# Patient Record
Sex: Male | Born: 1985 | Race: Black or African American | Hispanic: No | Marital: Single | Smoking: Current some day smoker
Health system: Southern US, Community
[De-identification: ages and names within clinical notes are randomized; demographics above are authoritative.]

## PROBLEM LIST (undated history)

## (undated) DIAGNOSIS — L0291 Cutaneous abscess, unspecified: Secondary | ICD-10-CM

## (undated) DIAGNOSIS — S62609A Fracture of unspecified phalanx of unspecified finger, initial encounter for closed fracture: Secondary | ICD-10-CM

## (undated) DIAGNOSIS — L732 Hidradenitis suppurativa: Secondary | ICD-10-CM

## (undated) DIAGNOSIS — I10 Essential (primary) hypertension: Secondary | ICD-10-CM

## (undated) HISTORY — DX: Hidradenitis suppurativa: L73.2

## (undated) HISTORY — DX: Essential (primary) hypertension: I10

---
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
## 2005-08-09 ENCOUNTER — Emergency Department (HOSPITAL_COMMUNITY): Admission: EM | Admit: 2005-08-09 | Discharge: 2005-08-10 | Payer: Self-pay | Admitting: Emergency Medicine

## 2005-08-12 ENCOUNTER — Emergency Department (HOSPITAL_COMMUNITY): Admission: EM | Admit: 2005-08-12 | Discharge: 2005-08-12 | Payer: Self-pay | Admitting: Emergency Medicine

## 2009-05-05 ENCOUNTER — Emergency Department (HOSPITAL_COMMUNITY): Admission: EM | Admit: 2009-05-05 | Discharge: 2009-05-05 | Payer: Self-pay | Admitting: Emergency Medicine

## 2009-05-08 ENCOUNTER — Emergency Department (HOSPITAL_COMMUNITY): Admission: EM | Admit: 2009-05-08 | Discharge: 2009-05-08 | Payer: Self-pay | Admitting: Emergency Medicine

## 2009-05-23 ENCOUNTER — Emergency Department (HOSPITAL_COMMUNITY): Admission: EM | Admit: 2009-05-23 | Discharge: 2009-05-23 | Payer: Self-pay | Admitting: Emergency Medicine

## 2009-11-15 ENCOUNTER — Emergency Department (HOSPITAL_COMMUNITY): Admission: EM | Admit: 2009-11-15 | Discharge: 2009-11-15 | Payer: Self-pay | Admitting: Emergency Medicine

## 2010-01-20 ENCOUNTER — Emergency Department (HOSPITAL_COMMUNITY): Admission: EM | Admit: 2010-01-20 | Discharge: 2010-01-20 | Payer: Self-pay | Admitting: Emergency Medicine

## 2010-07-14 ENCOUNTER — Emergency Department (HOSPITAL_COMMUNITY): Admission: EM | Admit: 2010-07-14 | Discharge: 2010-07-14 | Payer: Self-pay | Admitting: Emergency Medicine

## 2011-01-11 ENCOUNTER — Emergency Department (HOSPITAL_COMMUNITY)
Admission: EM | Admit: 2011-01-11 | Discharge: 2011-01-12 | Disposition: A | Discharge status: Home / Self Care | Payer: Self-pay | Attending: Emergency Medicine | Admitting: Emergency Medicine

## 2011-01-11 DIAGNOSIS — N509 Disorder of male genital organs, unspecified: Secondary | ICD-10-CM | POA: Insufficient documentation

## 2011-01-11 DIAGNOSIS — IMO0001 Reserved for inherently not codable concepts without codable children: Secondary | ICD-10-CM | POA: Insufficient documentation

## 2011-01-11 DIAGNOSIS — K612 Anorectal abscess: Secondary | ICD-10-CM | POA: Insufficient documentation

## 2011-01-11 DIAGNOSIS — L02219 Cutaneous abscess of trunk, unspecified: Secondary | ICD-10-CM | POA: Insufficient documentation

## 2011-01-11 DIAGNOSIS — L299 Pruritus, unspecified: Secondary | ICD-10-CM | POA: Insufficient documentation

## 2011-01-13 ENCOUNTER — Emergency Department (HOSPITAL_COMMUNITY)
Admission: EM | Admit: 2011-01-13 | Discharge: 2011-01-13 | Disposition: A | Discharge status: Home / Self Care | Payer: Self-pay | Attending: Emergency Medicine | Admitting: Emergency Medicine

## 2011-01-13 DIAGNOSIS — Z4801 Encounter for change or removal of surgical wound dressing: Secondary | ICD-10-CM | POA: Insufficient documentation

## 2011-01-13 DIAGNOSIS — L0231 Cutaneous abscess of buttock: Secondary | ICD-10-CM | POA: Insufficient documentation

## 2011-01-16 ENCOUNTER — Emergency Department (HOSPITAL_COMMUNITY)
Admission: EM | Admit: 2011-01-16 | Discharge: 2011-01-16 | Disposition: A | Discharge status: Home / Self Care | Payer: Self-pay | Attending: Emergency Medicine | Admitting: Emergency Medicine

## 2011-01-16 DIAGNOSIS — L03317 Cellulitis of buttock: Secondary | ICD-10-CM | POA: Insufficient documentation

## 2011-01-16 DIAGNOSIS — L0231 Cutaneous abscess of buttock: Secondary | ICD-10-CM | POA: Insufficient documentation

## 2011-01-16 DIAGNOSIS — Z4801 Encounter for change or removal of surgical wound dressing: Secondary | ICD-10-CM | POA: Insufficient documentation

## 2011-01-21 LAB — DIFFERENTIAL
Basophils Absolute: 0 10*3/uL (ref 0.0–0.1)
Basophils Relative: 0 % (ref 0–1)
Eosinophils Absolute: 0.1 10*3/uL (ref 0.0–0.7)
Eosinophils Relative: 0 % (ref 0–5)
Monocytes Absolute: 0.9 10*3/uL (ref 0.1–1.0)
Monocytes Relative: 6 % (ref 3–12)

## 2011-01-21 LAB — CBC
HCT: 45.7 % (ref 39.0–52.0)
Hemoglobin: 15.5 g/dL (ref 13.0–17.0)
MCHC: 34 g/dL (ref 30.0–36.0)
MCV: 91 fL (ref 78.0–100.0)
RBC: 5.02 MIL/uL (ref 4.22–5.81)
RDW: 13.8 % (ref 11.5–15.5)

## 2011-01-21 LAB — BASIC METABOLIC PANEL
BUN: 6 mg/dL (ref 6–23)
CO2: 26 mEq/L (ref 19–32)
Calcium: 9.2 mg/dL (ref 8.4–10.5)
Chloride: 104 mEq/L (ref 96–112)
Creatinine, Ser: 1.05 mg/dL (ref 0.4–1.5)
GFR calc Af Amer: >60 mL/min (ref >60)
GFR calc non Af Amer: >60 mL/min (ref >60)
Glucose, Bld: 94 mg/dL (ref 70–99)
Potassium: 4.6 mEq/L (ref 3.5–5.1)
Sodium: 137 mEq/L (ref 135–145)

## 2011-01-22 LAB — BASIC METABOLIC PANEL
BUN: 5 mg/dL — ABNORMAL LOW (ref 6–23)
CO2: 23 mEq/L (ref 19–32)
Calcium: 8.8 mg/dL (ref 8.4–10.5)
Chloride: 100 mEq/L (ref 96–112)
Creatinine, Ser: 0.98 mg/dL (ref 0.4–1.5)
GFR calc Af Amer: >60 mL/min (ref >60)
Glucose, Bld: 151 mg/dL — ABNORMAL HIGH (ref 70–99)

## 2011-01-22 LAB — CBC
MCHC: 34.4 g/dL (ref 30.0–36.0)
MCV: 92.5 fL (ref 78.0–100.0)
RBC: 4.99 MIL/uL (ref 4.22–5.81)
RDW: 13.1 % (ref 11.5–15.5)

## 2011-01-22 LAB — DIFFERENTIAL
Basophils Absolute: 0 10*3/uL (ref 0.0–0.1)
Basophils Relative: 0 % (ref 0–1)
Eosinophils Absolute: 0 10*3/uL (ref 0.0–0.7)
Monocytes Relative: 9 % (ref 3–12)
Neutrophils Relative %: 86 % — ABNORMAL HIGH (ref 43–77)

## 2012-08-16 ENCOUNTER — Emergency Department (HOSPITAL_COMMUNITY)
Admission: EM | Admit: 2012-08-16 | Discharge: 2012-08-16 | Disposition: A | Discharge status: Home / Self Care | Payer: BC Managed Care – PPO | Attending: Emergency Medicine | Admitting: Emergency Medicine

## 2012-08-16 ENCOUNTER — Emergency Department (HOSPITAL_COMMUNITY): Payer: BC Managed Care – PPO

## 2012-08-16 ENCOUNTER — Encounter (HOSPITAL_COMMUNITY): Payer: Self-pay | Admitting: Emergency Medicine

## 2012-08-16 DIAGNOSIS — F17200 Nicotine dependence, unspecified, uncomplicated: Secondary | ICD-10-CM | POA: Insufficient documentation

## 2012-08-16 DIAGNOSIS — R112 Nausea with vomiting, unspecified: Secondary | ICD-10-CM | POA: Insufficient documentation

## 2012-08-16 DIAGNOSIS — M545 Low back pain, unspecified: Secondary | ICD-10-CM | POA: Insufficient documentation

## 2012-08-16 DIAGNOSIS — M549 Dorsalgia, unspecified: Secondary | ICD-10-CM

## 2012-08-16 LAB — URINALYSIS, ROUTINE W REFLEX MICROSCOPIC
Bilirubin Urine: NEGATIVE
Hgb urine dipstick: NEGATIVE
Nitrite: NEGATIVE
Protein, ur: NEGATIVE mg/dL
Urobilinogen, UA: 0.2 mg/dL (ref 0.0–1.0)

## 2012-08-16 MED ORDER — HYDROCODONE-ACETAMINOPHEN 5-325 MG PO TABS: ORAL_TABLET | ORAL | Status: DC

## 2012-08-16 MED ORDER — ONDANSETRON HCL 4 MG PO TABS: 4.0000 mg | ORAL_TABLET | Freq: Four times a day (QID) | ORAL | Status: DC

## 2012-08-16 MED ORDER — CYCLOBENZAPRINE HCL 10 MG PO TABS: 10.0000 mg | ORAL_TABLET | Freq: Three times a day (TID) | ORAL | Status: DC | PRN

## 2012-08-16 NOTE — ED Provider Notes (Signed)
History     CSN: [id number]  Arrival date & time 08/16/12  1239   First MD Initiated Contact with Patient 08/16/12 1334      Chief Complaint  Patient presents with  . Nausea  . Back Pain    (Consider location/radiation/quality/duration/timing/severity/associated sxs/prior treatment) HPI Comments: Patient c/o low back pain for 24 hrs. And nausea for 2 days.  States that he has had three episodes of vomiting on the day prior to ed arrival.  States that he has been under a lot of stress at his job recently.  He denies abdominal pain, urinary sx's, extremity pain or weakness, fever or diarrhea.  States he has eaten and drank fluids on the day of ed arrival w/o further vomiting.    Patient is a 26 y.o. male presenting with back pain. The history is provided by the patient.  Back Pain  This is a new problem. The current episode started 2 days ago. The problem occurs constantly. The problem has not changed since onset.The pain is associated with lifting heavy objects. The pain is present in the lumbar spine. The quality of the pain is described as aching. The pain does not radiate. The pain is mild. The symptoms are aggravated by bending, twisting and certain positions. Pertinent negatives include no fever, no numbness, no headaches, no abdominal pain, no abdominal swelling, no bowel incontinence, no perianal numbness, no bladder incontinence, no dysuria, no pelvic pain, no leg pain, no paresthesias, no paresis, no tingling and no weakness. He has tried nothing for the symptoms. The treatment provided no relief.    History reviewed. No pertinent past medical history.  History reviewed. No pertinent past surgical history.  History reviewed. No pertinent family history.  History  Substance Use Topics  . Smoking status: Current Some Day Smoker  . Smokeless tobacco: Not on file  . Alcohol Use: Yes     occasional      Review of Systems  Constitutional: Negative for fever, activity change  and appetite change.  HENT: Negative for sore throat.   Respiratory: Negative for chest tightness and shortness of breath.   Gastrointestinal: Positive for nausea and vomiting. Negative for abdominal pain, diarrhea, constipation, abdominal distention and bowel incontinence.  Genitourinary: Negative for bladder incontinence, dysuria, hematuria, flank pain, decreased urine volume, difficulty urinating and pelvic pain.       No perineal numbness or incontinence of urine or feces  Musculoskeletal: Positive for back pain. Negative for joint swelling.  Skin: Negative for color change and rash.  Neurological: Negative for dizziness, tingling, weakness, numbness, headaches and paresthesias.  All other systems reviewed and are negative.    Allergies  Review of patient's allergies indicates no known allergies.  Home Medications  No current outpatient prescriptions on file.  BP 157/96  Pulse 69  Temp 98.4 F (36.9 C) (Oral)  Resp 17  SpO2 100%  Physical Exam  Nursing note and vitals reviewed. Constitutional: He is oriented to person, place, and time. He appears well-developed and well-nourished. No distress.  HENT:  Head: Normocephalic and atraumatic.  Mouth/Throat: Oropharynx is clear and moist.  Neck: Normal range of motion. Neck supple.  Cardiovascular: Normal rate, regular rhythm and intact distal pulses.   No murmur heard. Pulmonary/Chest: Effort normal and breath sounds normal. No respiratory distress.  Abdominal: Soft. Bowel sounds are normal. He exhibits no distension and no mass. There is no tenderness. There is no rebound and no guarding.  Musculoskeletal: He exhibits tenderness. He exhibits no edema.  Lumbar back: He exhibits tenderness and pain. He exhibits normal range of motion, no swelling, no deformity, no laceration and normal pulse.       Back:       ttp of the lumbar paraspinal muscles.  dp pulse are equal and brisk, distal sensation intact  Neurological: He is  alert and oriented to person, place, and time. No cranial nerve deficit or sensory deficit. He exhibits normal muscle tone. Coordination and gait normal.  Reflex Scores:      Patellar reflexes are 2+ on the right side and 2+ on the left side.      Achilles reflexes are 2+ on the right side and 2+ on the left side. Skin: Skin is warm and dry.    ED Course  Procedures (including critical care time)   Results for orders placed during the hospital encounter of 08/16/12  URINALYSIS, ROUTINE W REFLEX MICROSCOPIC      Component Value Range   Color, Urine YELLOW  YELLOW   APPearance CLEAR  CLEAR   Specific Gravity, Urine 1.020  1.005 - 1.030   pH 6.5  5.0 - 8.0   Glucose, UA NEGATIVE  NEGATIVE mg/dL   Hgb urine dipstick NEGATIVE  NEGATIVE   Bilirubin Urine NEGATIVE  NEGATIVE   Ketones, ur NEGATIVE  NEGATIVE mg/dL   Protein, ur NEGATIVE  NEGATIVE mg/dL   Urobilinogen, UA 0.2  0.0 - 1.0 mg/dL   Nitrite NEGATIVE  NEGATIVE   Leukocytes, UA NEGATIVE  NEGATIVE     Dg Lumbar Spine Complete  08/16/2012  *RADIOLOGY REPORT*  Clinical Data: Nausea and back pain.  LUMBAR SPINE - COMPLETE 4+ VIEW  Comparison: No priors.  Findings: Five views of the lumbar spine demonstrate no acute displaced fractures or compression type fractures.  Alignment is anatomic.  There is some degenerative disc disease with degenerative endplate changes of the anterior aspect of the superior endplate of L4.  IMPRESSION: 1.  No acute radiographic abnormality of the lumbar spine to account for the patient's symptoms. 2.  Mild degenerative disc disease at L3-L4, as above.   Original Report Authenticated By: Trudie Reed, M.D.          MDM    Patient has ttp of the lumbar paraspinal muscles.  No focal neuro deficits on exam.  Ambulates with a steady gait.  Abdomen is soft and nontender, no guarding or rebound tenderness. Patient is nontoxic appearing. Have reviewed laboratory studies and x-ray results with the patient.  He  agrees to close followup or to return to the ER for any worsening symptoms.  I am doubtful of an emergent neurological or infectious process    Prescribed:  norco #20 Flexeril zofran #8     Mesa Janus L. Adin, [place] 08/17/12 2235

## 2012-08-16 NOTE — ED Notes (Signed)
Alert, NAD, low back pain for 2-3 days, nausea , vomiting 3 times yesterday, x1 today.  Feels he is under a lot of stress due to 2 recent deaths in family and his job.  No injury to back known . No UTI sx.

## 2012-08-16 NOTE — ED Notes (Signed)
Nausea x 2 days. Vomited x 3 yesterday. Pain to lower back x 1 day. Denies gu sx's. Pain to back worse with standing. C/o of a lot of work/personal stress and feels this is related. Nad. Mm wet

## 2012-08-24 NOTE — ED Provider Notes (Signed)
Medical screening examination/treatment/procedure(s) were performed by non-physician practitioner and as supervising physician I was immediately available for consultation/collaboration. Devoria Albe, MD, Armando Gang   Ward Givens, MD 08/24/12 [phone]

## 2013-05-06 ENCOUNTER — Encounter (HOSPITAL_COMMUNITY): Payer: Self-pay | Admitting: Emergency Medicine

## 2013-05-06 ENCOUNTER — Emergency Department (HOSPITAL_COMMUNITY)
Admission: EM | Admit: 2013-05-06 | Discharge: 2013-05-06 | Disposition: A | Discharge status: Home / Self Care | Payer: Self-pay | Attending: Emergency Medicine | Admitting: Emergency Medicine

## 2013-05-06 DIAGNOSIS — L02419 Cutaneous abscess of limb, unspecified: Secondary | ICD-10-CM

## 2013-05-06 DIAGNOSIS — F17200 Nicotine dependence, unspecified, uncomplicated: Secondary | ICD-10-CM | POA: Insufficient documentation

## 2013-05-06 DIAGNOSIS — IMO0002 Reserved for concepts with insufficient information to code with codable children: Secondary | ICD-10-CM | POA: Insufficient documentation

## 2013-05-06 MED ORDER — LIDOCAINE HCL (PF) 1 % IJ SOLN
5.0000 mL | Freq: Once | INTRAMUSCULAR | Status: AC
  Administered 2013-05-06: 5 mL via INTRADERMAL
  Filled 2013-05-06: qty 5

## 2013-05-06 MED ORDER — HYDROCODONE-ACETAMINOPHEN 5-325 MG PO TABS: ORAL_TABLET | ORAL | Status: DC

## 2013-05-06 MED ORDER — SULFAMETHOXAZOLE-TMP DS 800-160 MG PO TABS
1.0000 | ORAL_TABLET | Freq: Once | ORAL | Status: AC
  Administered 2013-05-06: 1 via ORAL
  Filled 2013-05-06: qty 1

## 2013-05-06 MED ORDER — SULFAMETHOXAZOLE-TRIMETHOPRIM 800-160 MG PO TABS: 1.0000 | ORAL_TABLET | Freq: Two times a day (BID) | ORAL | Status: DC

## 2013-05-06 NOTE — ED Provider Notes (Signed)
History    CSN: [id number] Arrival date & time 05/06/13  1452  First MD Initiated Contact with Patient 05/06/13 1705     Chief Complaint  Patient presents with  . Abscess   (Consider location/radiation/quality/duration/timing/severity/associated sxs/prior Treatment) Patient is a 27 y.o. male presenting with abscess. The history is provided by the patient.  Abscess Location:  Shoulder/arm Shoulder/arm abscess location:  L axilla Abscess quality: fluctuance, painful and redness   Abscess quality: not draining, no induration, no itching and not weeping   Red streaking: no   Duration:  3 days Progression:  Worsening Pain details:    Quality:  Pressure and throbbing   Severity:  Moderate   Duration:  3 days   Timing:  Constant   Progression:  Worsening Chronicity:  New Context: not diabetes   Relieved by:  Nothing Worsened by:  Nothing tried Ineffective treatments:  None tried Associated symptoms: no fatigue, no fever, no headaches, no nausea and no vomiting   Risk factors: prior abscess    History reviewed. No pertinent past medical history. History reviewed. No pertinent past surgical history. History reviewed. No pertinent family history. History  Substance Use Topics  . Smoking status: Current Some Day Smoker  . Smokeless tobacco: Not on file  . Alcohol Use: Yes     Comment: occasional    Review of Systems  Constitutional: Negative for fever, chills and fatigue.  Respiratory: Negative for chest tightness and shortness of breath.   Gastrointestinal: Negative for nausea and vomiting.  Musculoskeletal: Negative for joint swelling and arthralgias.  Skin: Positive for color change.       Abscess   Neurological: Negative for headaches.  Hematological: Negative for adenopathy.  All other systems reviewed and are negative.    Allergies  Review of patient's allergies indicates no known allergies.  Home Medications   Current Outpatient Rx  Name  Route  Sig   Dispense  Refill  . Ichthammol 20 % OINT   Apply externally   Apply 1 application topically daily as needed (for pain).          BP 129/89  Pulse 88  Temp(Src) 98.7 F (37.1 C) (Oral)  Resp 20  Ht 6\' 3"  (1.905 m)  Wt 218 lb (98.884 kg)  BMI 27.25 kg/m2  SpO2 100% Physical Exam  Nursing note and vitals reviewed. Constitutional: He is oriented to person, place, and time. He appears well-developed and well-nourished. No distress.  HENT:  Head: Normocephalic and atraumatic.  Cardiovascular: Normal rate, regular rhythm, normal heart sounds and intact distal pulses.   No murmur heard. Pulmonary/Chest: Effort normal and breath sounds normal. No respiratory distress. He exhibits no tenderness.  Neurological: He is alert and oriented to person, place, and time. He exhibits normal muscle tone. Coordination normal.  Skin: Skin is warm. There is erythema.  Large, fluctuant abscess to the left axilla.  No significant surrounding erythema .  No red streaks    ED Course  Procedures (including critical care time) Labs Reviewed - No data to display   MDM    INCISION AND DRAINAGE Performed by: Maxwell Caul. Consent: Verbal consent obtained. Risks and benefits: risks, benefits and alternatives were discussed Type: abscess  Body area: left axilla  Anesthesia: local infiltration  Incision was made with a #11 scalpel.  Local anesthetic: lidocaine 1% w/o epinephrine  Anesthetic total: 4 ml  Complexity: complex Blunt dissection to break up loculations  Drainage: purulent  Drainage amount: copious  Packing material: 1/4 in  iodoform gauze  Patient tolerance: Patient tolerated the procedure well with no immediate complications.     Large fluctuant abscess to the left axilla, no significant surrounding erythema.  Pain improved after procedure.  NV intact.  Patient agrees to recheck in 2 days.  Will treat with bactrim DS and Vicodin #12  VSS.  Appears stable for  d/c  Ayannah Faddis L. Trisha Mangle, PA-C 05/08/13 2021

## 2013-05-06 NOTE — ED Notes (Signed)
Pt states he noticed a rash in his left arm pit on sat.  Now area has gotten bigger.

## 2013-05-06 NOTE — ED Notes (Signed)
Abscess to lt axillae x 2 days

## 2013-05-20 NOTE — ED Provider Notes (Signed)
Medical screening examination/treatment/procedure(s) were performed by non-physician practitioner and as supervising physician I was immediately available for consultation/collaboration. Nicklas Mcsweeney, MD, FACEP   Danisa Kopec L Quintus Premo, MD 05/20/13 1307 

## 2013-10-21 ENCOUNTER — Emergency Department (HOSPITAL_COMMUNITY)
Admission: EM | Admit: 2013-10-21 | Discharge: 2013-10-21 | Disposition: A | Discharge status: Home / Self Care | Payer: BC Managed Care – PPO | Attending: Emergency Medicine | Admitting: Emergency Medicine

## 2013-10-21 ENCOUNTER — Encounter (HOSPITAL_COMMUNITY): Payer: Self-pay | Admitting: Emergency Medicine

## 2013-10-21 DIAGNOSIS — M79609 Pain in unspecified limb: Secondary | ICD-10-CM | POA: Insufficient documentation

## 2013-10-21 DIAGNOSIS — M62830 Muscle spasm of back: Secondary | ICD-10-CM

## 2013-10-21 DIAGNOSIS — F17200 Nicotine dependence, unspecified, uncomplicated: Secondary | ICD-10-CM | POA: Insufficient documentation

## 2013-10-21 DIAGNOSIS — Z792 Long term (current) use of antibiotics: Secondary | ICD-10-CM | POA: Insufficient documentation

## 2013-10-21 DIAGNOSIS — M5380 Other specified dorsopathies, site unspecified: Secondary | ICD-10-CM | POA: Insufficient documentation

## 2013-10-21 MED ORDER — CYCLOBENZAPRINE HCL 10 MG PO TABS: 10.0000 mg | ORAL_TABLET | Freq: Two times a day (BID) | ORAL | Status: DC | PRN

## 2013-10-21 MED ORDER — PREDNISONE 10 MG PO TABS: 20.0000 mg | ORAL_TABLET | Freq: Every day | ORAL | Status: DC

## 2013-10-21 MED ORDER — PREDNISONE 50 MG PO TABS
60.0000 mg | ORAL_TABLET | Freq: Once | ORAL | Status: AC
  Administered 2013-10-21: 60 mg via ORAL
  Filled 2013-10-21 (×2): qty 1

## 2013-10-21 NOTE — Discharge Instructions (Signed)
Do not take the muscle relaxant if you are driving as it will make you sleepy. °

## 2013-10-21 NOTE — ED Provider Notes (Signed)
CSN: [id number]     Arrival date & time 10/21/13  1227 History   First MD Initiated Contact with Patient 10/21/13 1330     Chief Complaint  Patient presents with  . Back Pain   (Consider location/radiation/quality/duration/timing/severity/associated sxs/prior Treatment) Patient is a 28 y.o. male presenting with back pain. The history is provided by the patient.  Back Pain Location:  Lumbar spine Quality:  Aching and shooting Radiates to:  L posterior upper leg Pain severity:  Moderate Pain is:  Same all the time Onset quality:  Sudden Duration:  3 days Timing:  Constant Progression:  Worsening Chronicity:  New Context: physical stress   Relieved by:  Nothing Worsened by:  Movement, standing and bending Ineffective treatments:  Ibuprofen Associated symptoms: leg pain   Associated symptoms: no bladder incontinence, no bowel incontinence, no chest pain, no dysuria, no fever, no headaches and no perianal numbness    Aaron Parsons is a 28 y.o. male who presents to the ED with low back pain. He was bending down to pick up something off the floor 2 nights ago and felt a sudden sever pain in his lower back.   History reviewed. No pertinent past medical history. History reviewed. No pertinent past surgical history. History reviewed. No pertinent family history. History  Substance Use Topics  . Smoking status: Current Some Day Smoker  . Smokeless tobacco: Not on file  . Alcohol Use: Yes     Comment: occasional    Review of Systems  Constitutional: Negative for fever and chills.  HENT: Negative.   Eyes: Negative for visual disturbance.  Respiratory: Negative for shortness of breath.   Cardiovascular: Negative for chest pain.  Gastrointestinal: Negative for bowel incontinence.  Genitourinary: Negative for bladder incontinence, dysuria, urgency and frequency.  Musculoskeletal: Positive for back pain.  Skin: Negative for rash.  Allergic/Immunologic: Negative for  immunocompromised state.  Neurological: Negative for dizziness and headaches.  Psychiatric/Behavioral: Negative for confusion. The patient is not nervous/anxious.     Allergies  Review of patient's allergies indicates no known allergies.  Home Medications   Current Outpatient Rx  Name  Route  Sig  Dispense  Refill  . cyclobenzaprine (FLEXERIL) 10 MG tablet   Oral   Take 1 tablet (10 mg total) by mouth 2 (two) times daily as needed for muscle spasms.   20 tablet   0   . HYDROcodone-acetaminophen (NORCO/VICODIN) 5-325 MG per tablet      Take one-two tabs po q 4-6 hrs prn pain   12 tablet   0   . Ichthammol 20 % OINT   Apply externally   Apply 1 application topically daily as needed (for pain).         . predniSONE (DELTASONE) 10 MG tablet   Oral   Take 2 tablets (20 mg total) by mouth daily.   16 tablet   0   . sulfamethoxazole-trimethoprim (SEPTRA DS) 800-160 MG per tablet   Oral   Take 1 tablet by mouth 2 (two) times daily.   20 tablet   0    BP 154/98  Pulse 82  Temp(Src) 98.8 F (37.1 C) (Oral)  Resp 18  Ht 6\' 3"  (1.905 m)  Wt 223 lb (101.152 kg)  BMI 27.87 kg/m2  SpO2 100% Physical Exam  Nursing note and vitals reviewed. Constitutional: He is oriented to person, place, and time. He appears well-developed and well-nourished.  HENT:  Head: Normocephalic and atraumatic.  Eyes: Conjunctivae and EOM are normal.  Neck: Normal range of motion. Neck supple.  Cardiovascular: Normal rate and regular rhythm.   Pulmonary/Chest: Effort normal and breath sounds normal.  Abdominal: Soft. There is no tenderness.  Musculoskeletal:       Lumbar back: He exhibits decreased range of motion, tenderness and spasm. He exhibits no deformity and normal pulse.       Back:  Neurological: He is alert and oriented to person, place, and time. No cranial nerve deficit.  Skin: Skin is warm and dry.  Psychiatric: His behavior is normal.    ED Course  Procedures   MDM  28  y.o. male with low back pain that started after bending to pick something up. Normal neuro exam. Will treat for pain and spasm.    Medication List    TAKE these medications       cyclobenzaprine 10 MG tablet  Commonly known as:  FLEXERIL  Take 1 tablet (10 mg total) by mouth 2 (two) times daily as needed for muscle spasms.     predniSONE 10 MG tablet  Commonly known as:  DELTASONE  Take 2 tablets (20 mg total) by mouth daily.      ASK your doctor about these medications       HYDROcodone-acetaminophen 5-325 MG per tablet  Commonly known as:  NORCO/VICODIN  Take one-two tabs po q 4-6 hrs prn pain     Ichthammol 20 % Oint  Apply 1 application topically daily as needed (for pain).     sulfamethoxazole-trimethoprim 800-160 MG per tablet  Commonly known as:  SEPTRA DS  Take 1 tablet by mouth 2 (two) times daily.           [facility] [place] Orlene OchM Neese, [place] 10/21/13 [phone]

## 2013-10-21 NOTE — ED Notes (Signed)
Pt c/o lower back pain since Sunday. Pt states pain began when he bent over to pick up an object from the floor. Pain has gradually worsened.

## 2013-10-21 NOTE — ED Notes (Signed)
Pt c/o lower back pain that radiates down left leg, denies any incontinence of bowels or bladder, taking Ibuprofen at home-none today, last dose before midnight 3-4 tabs

## 2013-10-22 NOTE — ED Provider Notes (Signed)
Medical screening examination/treatment/procedure(s) were performed by non-physician practitioner and as supervising physician I was immediately available for consultation/collaboration.  EKG Interpretation   None         [name] L Danila Eddie, MD 10/22/13 1519 

## 2014-11-11 ENCOUNTER — Emergency Department (HOSPITAL_COMMUNITY)
Admission: EM | Admit: 2014-11-11 | Discharge: 2014-11-11 | Disposition: A | Discharge status: Home / Self Care | Payer: Managed Care, Other (non HMO) | Attending: Emergency Medicine | Admitting: Emergency Medicine

## 2014-11-11 ENCOUNTER — Encounter (HOSPITAL_COMMUNITY): Payer: Self-pay | Admitting: *Deleted

## 2014-11-11 DIAGNOSIS — R030 Elevated blood-pressure reading, without diagnosis of hypertension: Secondary | ICD-10-CM | POA: Insufficient documentation

## 2014-11-11 DIAGNOSIS — Z7952 Long term (current) use of systemic steroids: Secondary | ICD-10-CM | POA: Insufficient documentation

## 2014-11-11 DIAGNOSIS — L02412 Cutaneous abscess of left axilla: Secondary | ICD-10-CM | POA: Insufficient documentation

## 2014-11-11 DIAGNOSIS — Z720 Tobacco use: Secondary | ICD-10-CM | POA: Diagnosis not present

## 2014-11-11 DIAGNOSIS — Z792 Long term (current) use of antibiotics: Secondary | ICD-10-CM | POA: Diagnosis not present

## 2014-11-11 MED ORDER — HYDROCODONE-ACETAMINOPHEN 5-325 MG PO TABS: 1.0000 | ORAL_TABLET | ORAL | Status: DC | PRN

## 2014-11-11 MED ORDER — LIDOCAINE HCL (PF) 1 % IJ SOLN
INTRAMUSCULAR | Status: AC
  Administered 2014-11-11: 09:00:00
  Filled 2014-11-11: qty 5

## 2014-11-11 MED ORDER — POVIDONE-IODINE 10 % EX SOLN
CUTANEOUS | Status: AC
  Administered 2014-11-11: 09:00:00
  Filled 2014-11-11: qty 118

## 2014-11-11 NOTE — ED Notes (Signed)
Abscess under left arm x 1 week

## 2014-11-11 NOTE — Discharge Instructions (Signed)
Abscess °An abscess (boil or furuncle) is an infected area on or under the skin. This area is filled with yellowish-white fluid (pus) and other material (debris). °HOME CARE  °· Only take medicines as told by your doctor. °· If you were given antibiotic medicine, take it as directed. Finish the medicine even if you start to feel better. °· If gauze is used, follow your doctor's directions for changing the gauze. °· To avoid spreading the infection: °¨ Keep your abscess covered with a bandage. °¨ Wash your hands well. °¨ Do not share personal care items, towels, or whirlpools with others. °¨ Avoid skin contact with others. °· Keep your skin and clothes clean around the abscess. °· Keep all doctor visits as told. °GET HELP RIGHT AWAY IF:  °· You have more pain, puffiness (swelling), or redness in the wound site. °· You have more fluid or blood coming from the wound site. °· You have muscle aches, chills, or you feel sick. °· You have a fever. °MAKE SURE YOU:  °· Understand these instructions. °· Will watch your condition. °· Will get help right away if you are not doing well or get worse. °Document Released: 03/20/2008 Document Revised: 04/02/2012 Document Reviewed: 12/15/2011 °Exit[employer]® Patient Information ©2015 [employer]. This information is not intended to replace advice given to you by your health care provider. Make sure you discuss any questions you have with your health care provider. ° °

## 2014-11-11 NOTE — ED Provider Notes (Signed)
CSN: [id number]     Arrival date & time 11/11/14  4098 History   First MD Initiated Contact with Patient 11/11/14 [phone]     Chief Complaint  Patient presents with  . Abscess     (Consider location/radiation/quality/duration/timing/severity/associated sxs/prior Treatment) Patient is a 29 y.o. male presenting with abscess. The history is provided by the patient.  Abscess Location:  Shoulder/arm Shoulder/arm abscess location:  L axilla Abscess quality: fluctuance, painful, redness and warmth   Red streaking: no   Duration:  1 week Progression:  Worsening Pain details:    Quality:  Sharp and pressure   Severity:  Moderate   Timing:  Constant   Progression:  Worsening Chronicity:  New Relieved by:  Nothing Worsened by:  Nothing tried Ineffective treatments:  Warm compresses  Aaron Parsons is a 29 y.o. male who presents to the ED with an abscess to the left axilla that started a week ago and has gotten worse. He has taken Aleve and applied warm wet compresses to the area without relief. He has had abscesses in the past. He denies fever, n/v or other problems.   History reviewed. No pertinent past medical history. History reviewed. No pertinent past surgical history. No family history on file. History  Substance Use Topics  . Smoking status: Current Some Day Smoker  . Smokeless tobacco: Not on file  . Alcohol Use: Yes     Comment: occasional    Review of Systems Negative except as stated in HPI   Allergies  Review of patient's allergies indicates no known allergies.  Home Medications   Prior to Admission medications   Medication Sig Start Date End Date Taking? Authorizing Provider  Pseudoephedrine-Naproxen Na (ALEVE-D SINUS & COLD PO) Take 1 tablet by mouth daily as needed (sinus).   Yes Historical Provider, MD  cyclobenzaprine (FLEXERIL) 10 MG tablet Take 1 tablet (10 mg total) by mouth 2 (two) times daily as needed for muscle spasms. Patient not taking:  Reported on 11/11/2014 10/21/13   Janne Napoleon, NP  HYDROcodone-acetaminophen (NORCO/VICODIN) 5-325 MG per tablet Take 1 tablet by mouth every 4 (four) hours as needed. 11/11/14   Jade Burkard Orlene Och, NP  Ichthammol 20 % OINT Apply 1 application topically daily as needed (for pain).    Historical Provider, MD  predniSONE (DELTASONE) 10 MG tablet Take 2 tablets (20 mg total) by mouth daily. Patient not taking: Reported on 11/11/2014 10/21/13   Janne Napoleon, NP  sulfamethoxazole-trimethoprim (SEPTRA DS) 800-160 MG per tablet Take 1 tablet by mouth 2 (two) times daily. 05/06/13   Tammy L. Triplett, PA-C   BP 154/110 mmHg  Pulse 84  Temp(Src) 97.7 F (36.5 C) (Oral)  Resp 16  SpO2 100% Physical Exam  Constitutional: He is oriented to person, place, and time. He appears well-developed and well-nourished.  Eyes: EOM are normal.  Neck: Neck supple.  Cardiovascular: Normal rate.   Elevated BP  Pulmonary/Chest: Effort normal.  Musculoskeletal: Normal range of motion.  Neurological: He is alert and oriented to person, place, and time. No cranial nerve deficit.  Skin: Skin is warm and dry.  Psychiatric: He has a normal mood and affect. His behavior is normal.  Nursing note and vitals reviewed.   ED Course  Procedures  INCISION AND DRAINAGE Performed by: Chritopher Coster Consent: Verbal consent obtained. Risks and benefits: risks, benefits and alternatives were discussed Type: abscess  Body area: left axilla  Cleaned with betadine draped  Anesthesia: local infiltration  Incision was made with  a scalpel.  Local anesthetic: lidocaine 1% without epinephrine  Anesthetic total: 3 ml  Complexity: complex Blunt dissection to break up loculations  Drainage: purulent  Drainage amount: moderate  Irrigated with NSS  Packing material: 1/4 in iodoform gauze  Patient tolerance: Patient tolerated the procedure well with no immediate complications.    MDM  29 y.o. male with pain and swelling due to  abscess left axilla. Will treat for pain and he will return in 2 days for recheck and packing removal. He will return sooner for any problems. Stable for d/c without red streaking, fever or other problems.   Addressed elevated BP and need for follow up. Patient will tray and get appointment with a PCP.   Final diagnoses:  Abscess of left axilla  BP 152/99 mmHg  Pulse 71  Temp(Src) 98.4 F (36.9 C) (Oral)  Resp 18  SpO2 99%     Janne NapoleonHope M Ruie Sendejo, NP 11/11/14 [phone]  [facility] Ciearra Rufo, NP 11/11/14 [phone]  Gilda Creasehristopher J. Pollina, MD 11/11/14 1538

## 2014-11-11 NOTE — ED Notes (Signed)
Dressing applied to L axilla.

## 2014-11-13 ENCOUNTER — Emergency Department (HOSPITAL_COMMUNITY)
Admission: EM | Admit: 2014-11-13 | Discharge: 2014-11-13 | Disposition: A | Discharge status: Home / Self Care | Payer: Managed Care, Other (non HMO) | Attending: Emergency Medicine | Admitting: Emergency Medicine

## 2014-11-13 ENCOUNTER — Encounter (HOSPITAL_COMMUNITY): Payer: Self-pay | Admitting: Emergency Medicine

## 2014-11-13 DIAGNOSIS — Z720 Tobacco use: Secondary | ICD-10-CM | POA: Insufficient documentation

## 2014-11-13 DIAGNOSIS — Z7952 Long term (current) use of systemic steroids: Secondary | ICD-10-CM | POA: Insufficient documentation

## 2014-11-13 DIAGNOSIS — Z5189 Encounter for other specified aftercare: Secondary | ICD-10-CM

## 2014-11-13 DIAGNOSIS — Z79899 Other long term (current) drug therapy: Secondary | ICD-10-CM | POA: Insufficient documentation

## 2014-11-13 DIAGNOSIS — Z4801 Encounter for change or removal of surgical wound dressing: Secondary | ICD-10-CM | POA: Diagnosis not present

## 2014-11-13 NOTE — Care Management Note (Signed)
ED/CM noted patient did not have health insurance and/or PCP listed in the computer.  Patient was given the [place] resource handout with information on the clinics, food pantries, and the handout for new health insurance sign-up.  Patient expressed appreciation for information received. 

## 2014-11-13 NOTE — Discharge Instructions (Signed)
Continue to apply warm wet compresses to the area. Take ibuprofen as needed for discomfort. Return for any problems.

## 2014-11-13 NOTE — ED Provider Notes (Signed)
CSN: [id number]     Arrival date & time 11/13/14  0857 History   First MD Initiated Contact with Patient 11/13/14 [phone]     Chief Complaint  Patient presents with  . Wound Check     (Consider location/radiation/quality/duration/timing/severity/associated sxs/prior Treatment) Patient is a 29 y.o. male presenting with wound check. The history is provided by the patient.  Wound Check   Aaron Parsons is a 29 y.o. male who presents to the ED for wound check. He was here 2 days ago for I&D of abscess to the left axilla. He denies any problems. The area has continued to drain and the packing remains in place.   History reviewed. No pertinent past medical history. History reviewed. No pertinent past surgical history. History reviewed. No pertinent family history. History  Substance Use Topics  . Smoking status: Current Some Day Smoker  . Smokeless tobacco: Not on file  . Alcohol Use: Yes     Comment: occasional    Review of Systems Negative except as stated in HPI   Allergies  Review of patient's allergies indicates no known allergies.  Home Medications   Prior to Admission medications   Medication Sig Start Date End Date Taking? Authorizing Provider  Pseudoephedrine-Naproxen Na (ALEVE-D SINUS & COLD PO) Take 1 tablet by mouth daily as needed (sinus).   Yes Historical Provider, MD  cyclobenzaprine (FLEXERIL) 10 MG tablet Take 1 tablet (10 mg total) by mouth 2 (two) times daily as needed for muscle spasms. Patient not taking: Reported on 11/11/2014 10/21/13   Janne NapoleonHope M Halana Deisher, NP  HYDROcodone-acetaminophen (NORCO/VICODIN) 5-325 MG per tablet Take 1 tablet by mouth every 4 (four) hours as needed. 11/11/14   Aleysha Meckler Orlene OchM Kara Mierzejewski, NP  predniSONE (DELTASONE) 10 MG tablet Take 2 tablets (20 mg total) by mouth daily. Patient not taking: Reported on 11/11/2014 10/21/13   Janne NapoleonHope M Estanislado Surgeon, NP  sulfamethoxazole-trimethoprim (SEPTRA DS) 800-160 MG per tablet Take 1 tablet by mouth 2 (two) times  daily. Patient not taking: Reported on 11/11/2014 05/06/13   Tammy L. Triplett, PA-C   BP 143/101 mmHg  Pulse 82  Temp(Src) 98.8 F (37.1 C) (Oral)  Resp 16  Ht 6\' 3"  (1.905 m)  Wt 217 lb (98.431 kg)  BMI 27.12 kg/m2  SpO2 100% Physical Exam  Constitutional: He is oriented to person, place, and time. He appears well-developed and well-nourished.  Eyes: EOM are normal.  Neck: Neck supple.  Pulmonary/Chest: Effort normal.  Abdominal: Soft. There is no tenderness.  Musculoskeletal:  Packing in place left axilla and continues to drain.   Neurological: He is alert and oriented to person, place, and time. No cranial nerve deficit.  Skin: Skin is warm and dry.  Nursing note and vitals reviewed.   ED Course  Procedures (including critical care time) Packing removed without difficulty.  Labs Review  MDM  29 y.o. male here for packing removal and wound recheck. Stable for d/c without signs of cellulitis. He will continue to apply warm wet compresses and return for any problems.  Final diagnoses:  Wound check, abscess      Janne NapoleonHope M Evora Schechter, NP 11/13/14 1623  Vida RollerBrian D Miller, MD 11/14/14 [phone]

## 2014-11-13 NOTE — ED Notes (Signed)
Pt here for recheck of abscess to L axilla.

## 2015-01-29 ENCOUNTER — Emergency Department (HOSPITAL_COMMUNITY)
Admission: EM | Admit: 2015-01-29 | Discharge: 2015-01-29 | Disposition: A | Discharge status: Home / Self Care | Payer: Managed Care, Other (non HMO) | Attending: Emergency Medicine | Admitting: Emergency Medicine

## 2015-01-29 ENCOUNTER — Emergency Department (HOSPITAL_COMMUNITY): Payer: Managed Care, Other (non HMO)

## 2015-01-29 ENCOUNTER — Encounter (HOSPITAL_COMMUNITY): Payer: Self-pay | Admitting: *Deleted

## 2015-01-29 DIAGNOSIS — Y9289 Other specified places as the place of occurrence of the external cause: Secondary | ICD-10-CM | POA: Insufficient documentation

## 2015-01-29 DIAGNOSIS — Z720 Tobacco use: Secondary | ICD-10-CM | POA: Insufficient documentation

## 2015-01-29 DIAGNOSIS — X58XXXA Exposure to other specified factors, initial encounter: Secondary | ICD-10-CM | POA: Diagnosis not present

## 2015-01-29 DIAGNOSIS — S62350A Nondisplaced fracture of shaft of second metacarpal bone, right hand, initial encounter for closed fracture: Secondary | ICD-10-CM | POA: Insufficient documentation

## 2015-01-29 DIAGNOSIS — Y9389 Activity, other specified: Secondary | ICD-10-CM | POA: Insufficient documentation

## 2015-01-29 DIAGNOSIS — Y990 Civilian activity done for income or pay: Secondary | ICD-10-CM | POA: Insufficient documentation

## 2015-01-29 DIAGNOSIS — S62309A Unspecified fracture of unspecified metacarpal bone, initial encounter for closed fracture: Secondary | ICD-10-CM

## 2015-01-29 DIAGNOSIS — S6991XA Unspecified injury of right wrist, hand and finger(s), initial encounter: Secondary | ICD-10-CM | POA: Diagnosis present

## 2015-01-29 MED ORDER — HYDROCODONE-ACETAMINOPHEN 5-325 MG PO TABS: 1.0000 | ORAL_TABLET | ORAL | Status: DC | PRN

## 2015-01-29 MED ORDER — IBUPROFEN 600 MG PO TABS: 600.0000 mg | ORAL_TABLET | Freq: Four times a day (QID) | ORAL | Status: DC | PRN

## 2015-01-29 NOTE — ED Notes (Signed)
Unable to locate patient.

## 2015-01-29 NOTE — ED Provider Notes (Signed)
CSN: [id number]     Arrival date & time 01/29/15  1958 History   First MD Initiated Contact with Patient 01/29/15 2014     No chief complaint on file.    (Consider location/radiation/quality/duration/timing/severity/associated sxs/prior Treatment) The history is provided by the patient.   Aaron Parsons is a 29 y.o. male presenting with pain and swelling of his right hand and index finger after injuring it at work last night.  He describes attempting to push a part into position when he felt a sudden snapping pain and had transient deformity of his right index finger, stating it was tucked under his third finger until it was manipulated back into position by a coworker.  Since he has applied ice without relief.  He denies numbness in the finger tip but does endorse weakness and pain with attempts to flex and extend the finger.  He has noted swelling in his fingerweb space between the thumb and index finger.     History reviewed. No pertinent past medical history. History reviewed. No pertinent past surgical history. History reviewed. No pertinent family history. History  Substance Use Topics  . Smoking status: Current Some Day Smoker  . Smokeless tobacco: Not on file  . Alcohol Use: Yes     Comment: occasional    Review of Systems  Constitutional: Negative for fever.  Musculoskeletal: Positive for joint swelling and arthralgias. Negative for myalgias.  Neurological: Negative for weakness and numbness.      Allergies  Review of patient's allergies indicates no known allergies.  Home Medications   Prior to Admission medications   Medication Sig Start Date End Date Taking? Authorizing Provider  cyclobenzaprine (FLEXERIL) 10 MG tablet Take 1 tablet (10 mg total) by mouth 2 (two) times daily as needed for muscle spasms. Patient not taking: Reported on 11/11/2014 10/21/13   Janne Napoleon, NP  HYDROcodone-acetaminophen (NORCO/VICODIN) 5-325 MG per tablet Take 1 tablet by  mouth every 4 (four) hours as needed. 01/29/15   Burgess Amor, PA-C  HYDROcodone-acetaminophen (NORCO/VICODIN) 5-325 MG per tablet Take 1 tablet by mouth every 4 (four) hours as needed. 01/29/15   Burgess Amor, PA-C  ibuprofen (ADVIL,MOTRIN) 600 MG tablet Take 1 tablet (600 mg total) by mouth every 6 (six) hours as needed. 01/29/15   Burgess Amor, PA-C  predniSONE (DELTASONE) 10 MG tablet Take 2 tablets (20 mg total) by mouth daily. Patient not taking: Reported on 11/11/2014 10/21/13   Janne Napoleon, NP  Pseudoephedrine-Naproxen Na (ALEVE-D SINUS & COLD PO) Take 1 tablet by mouth daily as needed (sinus).    Historical Provider, MD  sulfamethoxazole-trimethoprim (SEPTRA DS) 800-160 MG per tablet Take 1 tablet by mouth 2 (two) times daily. Patient not taking: Reported on 11/11/2014 05/06/13   Tammy Triplett, PA-C   BP 148/94 mmHg  Pulse 79  Temp(Src) 98.2 F (36.8 C) (Oral)  Resp 18  Ht 6' 3.5" (1.918 m)  Wt 212 lb (96.163 kg)  BMI 26.14 kg/m2  SpO2 100% Physical Exam  Constitutional: He appears well-developed and well-nourished.  HENT:  Head: Atraumatic.  Neck: Normal range of motion.  Cardiovascular:  Pulses equal bilaterally  Musculoskeletal: He exhibits edema and tenderness.       Right hand: He exhibits decreased range of motion, tenderness, bony tenderness and swelling. He exhibits normal capillary refill. Normal sensation noted. He exhibits no wrist extension trouble.  ttp along proximal right index finger and along same metacarpal.  There is a firm nodule in the dorsal web space  between the thumb and index metacarpal, possibly hematoma, but without ecchymosis. No deformity.  Distal cap refill less than 2 sec, distal sensation in fingertips normal.  Neurological: He is alert. He has normal strength. He displays normal reflexes. No sensory deficit.  Skin: Skin is warm and dry.  Psychiatric: He has a normal mood and affect.    ED Course  Procedures (including critical care time) Labs  Review Labs Reviewed - No data to display  Imaging Review Dg Hand Complete Right  01/29/2015   CLINICAL DATA:  Right hand pain after injury at work. Pain about the second digit, knot above the thumb.  EXAM: RIGHT HAND - COMPLETE 3+ VIEW  COMPARISON:  None.  FINDINGS: There is an oblique fracture of the second metacarpal shaft. No definite displacement. No articular extension. No additional fracture of the hand. Mild dorsal soft tissue edema is seen  IMPRESSION: Oblique nondisplaced second metacarpal fracture.   Electronically Signed   By: Rubye OaksMelanie  Ehinger M.D.   On: 01/29/2015 21:41     EKG Interpretation None      MDM   Final diagnoses:  Metacarpal bone fracture, closed, initial encounter    Pt was placed in a radial gutter splint, sling provided. RICE, hydrocdoone, ibuprofen prescribed.  He was referred to hand for f/u care - pt understands to call for appt on Monday.  Return here over the weekend for any worsened sx.        Burgess AmorJulie Hilde Churchman, PA-C 01/30/15 [phone]  Raeford RazorStephen Kohut, MD 02/01/15 [phone]

## 2015-01-29 NOTE — ED Notes (Signed)
Pt located in waiting room, where he was mistakenly sent after xray

## 2015-01-29 NOTE — ED Notes (Signed)
Pt c/o pain to his right pointer finger since last night. Pt states he was injured at work.

## 2015-01-29 NOTE — Discharge Instructions (Signed)
Metacarpal Fracture   The metacarpal bones are in the middle of the hand, connecting the fingers to the wrist. A metacarpal fracture is a break in one of these bones. It is common for an injury of the hand to break one or more of these bones. A metacarpal fracture of the fifth (little) finger, near the knuckle, is also known as a boxer's fracture. SYMPTOMS   Severe pain at the time of injury.  Pain, tenderness, swelling (especially the back of the hand).  Bruising of the hand within 48 hours.  Visible deformity, if the fracture out of alignment (displaced).  Numbness or paralysis from swelling in the hand, causing pressure on the blood vessels or nerves (uncommon). CAUSES   Direct hit (trauma) to the hand, such as a striking blow with the fist.  Indirect stress to the hand, such as twisting or violent muscle contraction (uncommon). RISK INCREASES WITH:  Contact sports (football, rugby, soccer).  Sports that require hitting (boxing, martial arts).  History of bone or joint disease, including osteoporosis.  Poor hand strength and flexibility. PREVENTION  Maintain proper conditioning:  Hand and finger strength.  Flexibility and endurance.  For contact sports, wear properly fitted and padded protective equipment for the hand.  Learn and use proper technique when hitting, punching, and landing from a fall. PROGNOSIS If treated properly, metacarpal fractures can be expected to heal within 4 to 6 weeks. For severe injuries, surgery may be needed. RELATED COMPLICATIONS   Fracture does not heal (nonunion).  Heals in a poor position, including twisted fingers (malunion).  Chronic pain, stiffness, or swelling of the hand.  Excessive bleeding in the hand, causing pressure and injury to nerves and blood vessels (rare).  Unstable or arthritic joint, following repeated injury or delayed treatment.  Hindrance of normal hand growth in children.  Infection in open fractures (skin  broken over fracture) or at the incision or pin sites, if surgery was performed.  Shortening or injured bones.  Bony bump (spur) or loss of shape of the knuckles. TREATMENT  Treatment will vary, depending on the extent of the injury. First, ice and medicine will help reduce pain and inflammation. For a single metacarpal fracture that is not displaced and does not involve the joint, restraint is usually sufficient for healing to occur. Multiple metacarpal fractures, fractures that are displaced, or fractures involving the joint may require surgery. Surgery often involves placing pins and screws in the bones, to hold them in place. Restraint of the injury follows surgery, to allow for healing. After restraint (with or without surgery), stretching and strengthening exercises may be needed to regain strength and a full range of motion. Exercises may be done at home or with a therapist. Sometimes, depending on the sport and position, a brace or splint may be needed when first returning to sports. MEDICATION   Do not take pain medicine for 7 days before surgery.  Only take over-the-counter or prescription medicines for pain, fever, or discomfort as directed by your caregiver.  Prescription pain medicines are usually prescribed only after surgery. Use only as directed and only as much as you need. COLD THERAPY  Cold treatment (icing) should be applied for 10 to 15 minutes every 2 to 3 hours for inflammation and pain, and immediately after activity that aggravates your symptoms. Use ice packs or an ice massage. SEEK IMMEDIATE MEDICAL CARE IF:   Pain, tenderness, or swelling gets worse even with treatment.  You have pain, numbness, or coldness in the   hand.  Blue, gray, or dark color appears in the fingernails.  Any of the following occur after surgery:  You have an oral temperature above 102 F (38.9 C), not controlled by medicine.  You have increased pain, swelling, redness, drainage of fluids,  or bleeding in the affected area.  New, unexplained symptoms develop. (Drugs used in treatment may produce side effects.) Document Released: 10/16/1998 Document Revised: 12/25/2011 Document Reviewed: 01/14/2009 [employer] Patient Information 2015 [employer]. This information is not intended to replace advice given to you by your health care provider. Make sure you discuss any questions you have with your health care provider.  

## 2015-02-09 MED FILL — Hydrocodone-Acetaminophen Tab 5-325 MG: ORAL | Qty: 6 | Status: AC

## 2015-07-20 ENCOUNTER — Emergency Department (HOSPITAL_COMMUNITY)
Admission: EM | Admit: 2015-07-20 | Discharge: 2015-07-20 | Disposition: A | Discharge status: Home / Self Care | Payer: Managed Care, Other (non HMO) | Attending: Emergency Medicine | Admitting: Emergency Medicine

## 2015-07-20 ENCOUNTER — Encounter (HOSPITAL_COMMUNITY): Payer: Self-pay | Admitting: Emergency Medicine

## 2015-07-20 DIAGNOSIS — L02211 Cutaneous abscess of abdominal wall: Secondary | ICD-10-CM | POA: Insufficient documentation

## 2015-07-20 DIAGNOSIS — Z8781 Personal history of (healed) traumatic fracture: Secondary | ICD-10-CM | POA: Diagnosis not present

## 2015-07-20 DIAGNOSIS — Z720 Tobacco use: Secondary | ICD-10-CM | POA: Diagnosis not present

## 2015-07-20 HISTORY — DX: Fracture of unspecified phalanx of unspecified finger, initial encounter for closed fracture: S62.609A

## 2015-07-20 HISTORY — DX: Cutaneous abscess, unspecified: L02.91

## 2015-07-20 MED ORDER — HYDROCODONE-ACETAMINOPHEN 7.5-325 MG PO TABS: 1.0000 | ORAL_TABLET | ORAL | Status: DC | PRN

## 2015-07-20 MED ORDER — DOXYCYCLINE HYCLATE 100 MG PO CAPS: 100.0000 mg | ORAL_CAPSULE | Freq: Two times a day (BID) | ORAL | Status: DC

## 2015-07-20 MED ORDER — LIDOCAINE HCL (PF) 2 % IJ SOLN
INTRAMUSCULAR | Status: AC
  Administered 2015-07-20: 12:00:00
  Filled 2015-07-20: qty 10

## 2015-07-20 NOTE — Discharge Instructions (Signed)
Starting tomorrow night 10/5, please begin warm tub soaks for 15 min daily until wound resolves. Use doxycycline daily until all taken. Use tylenol or ibuprofen for mild pain. Use norco for more severe pain. This medication may cause drowsiness, use with caution. Abscess An abscess (boil or furuncle) is an infected area on or under the skin. This area is filled with yellowish-white fluid (pus) and other material (debris). HOME CARE   Only take medicines as told by your doctor.  If you were given antibiotic medicine, take it as directed. Finish the medicine even if you start to feel better.  If gauze is used, follow your doctor's directions for changing the gauze.  To avoid spreading the infection:  Keep your abscess covered with a bandage.  Wash your hands well.  Do not share personal care items, towels, or whirlpools with others.  Avoid skin contact with others.  Keep your skin and clothes clean around the abscess.  Keep all doctor visits as told. GET HELP RIGHT AWAY IF:   You have more pain, puffiness (swelling), or redness in the wound site.  You have more fluid or blood coming from the wound site.  You have muscle aches, chills, or you feel sick.  You have a fever. MAKE SURE YOU:   Understand these instructions.  Will watch your condition.  Will get help right away if you are not doing well or get worse. Document Released: 03/20/2008 Document Revised: 04/02/2012 Document Reviewed: 12/15/2011 [facility] Patient Information 2015 [place], [place]. This information is not intended to replace advice given to you by your health care provider. Make sure you discuss any questions you have with your health care provider.

## 2015-07-20 NOTE — ED Notes (Signed)
Pt c/o of abscess to RT abdomen. Denies N/V. Denies fever/chills. Pt states he has used warm compresses with no relief.

## 2015-07-20 NOTE — ED Provider Notes (Signed)
CSN: [id number]     Arrival date & time 07/20/15  1055 History   First MD Initiated Contact with Patient 07/20/15 1120     Chief Complaint  Patient presents with  . Abscess     (Consider location/radiation/quality/duration/timing/severity/associated sxs/prior Treatment) Patient is a 29 y.o. male presenting with abscess. The history is provided by the patient.  Abscess Location:  Torso Torso abscess location: right abdomen. Abscess quality: fluctuance, painful and redness   Abscess quality: not draining   Red streaking: no   Pain details:    Duration:  1 week   Timing:  Intermittent   Progression:  Worsening Chronicity:  New Context: not diabetes and not immunosuppression   Relieved by:  Nothing Exacerbated by: palpation. Ineffective treatments:  Warm compresses Associated symptoms: no fatigue, no fever and no nausea   Risk factors: prior abscess     Past Medical History  Diagnosis Date  . Abscess   . Fracture of finger of right hand    History reviewed. No pertinent past surgical history. No family history on file. Social History  Substance Use Topics  . Smoking status: Current Some Day Smoker  . Smokeless tobacco: None  . Alcohol Use: Yes     Comment: occasional    Review of Systems  Constitutional: Negative for fever and fatigue.  Gastrointestinal: Negative for nausea.  Skin:       abscess  All other systems reviewed and are negative.     Allergies  Review of patient's allergies indicates no known allergies.  Home Medications   Prior to Admission medications   Medication Sig Start Date End Date Taking? Authorizing Provider  cyclobenzaprine (FLEXERIL) 10 MG tablet Take 1 tablet (10 mg total) by mouth 2 (two) times daily as needed for muscle spasms. Patient not taking: Reported on 11/11/2014 10/21/13   Janne Napoleon, NP  HYDROcodone-acetaminophen (NORCO/VICODIN) 5-325 MG per tablet Take 1 tablet by mouth every 4 (four) hours as needed. 01/29/15   Burgess Amor,  PA-C  HYDROcodone-acetaminophen (NORCO/VICODIN) 5-325 MG per tablet Take 1 tablet by mouth every 4 (four) hours as needed. 01/29/15   Burgess Amor, PA-C  ibuprofen (ADVIL,MOTRIN) 600 MG tablet Take 1 tablet (600 mg total) by mouth every 6 (six) hours as needed. 01/29/15   Burgess Amor, PA-C  predniSONE (DELTASONE) 10 MG tablet Take 2 tablets (20 mg total) by mouth daily. Patient not taking: Reported on 11/11/2014 10/21/13   Janne Napoleon, NP  Pseudoephedrine-Naproxen Na (ALEVE-D SINUS & COLD PO) Take 1 tablet by mouth daily as needed (sinus).    Historical Provider, MD  sulfamethoxazole-trimethoprim (SEPTRA DS) 800-160 MG per tablet Take 1 tablet by mouth 2 (two) times daily. Patient not taking: Reported on 11/11/2014 05/06/13   Tammy Triplett, PA-C   BP 156/97 mmHg  Pulse 94  Temp(Src) 98.3 F (36.8 C) (Oral)  Resp 18  Ht 6' 3.5" (1.918 m)  Wt 217 lb (98.431 kg)  BMI 26.76 kg/m2  SpO2 100% Physical Exam  Constitutional: He is oriented to person, place, and time. He appears well-developed and well-nourished.  Non-toxic appearance.  HENT:  Head: Normocephalic.  Right Ear: Tympanic membrane and external ear normal.  Left Ear: Tympanic membrane and external ear normal.  Eyes: EOM and lids are normal. Pupils are equal, round, and reactive to light.  Neck: Normal range of motion. Neck supple. Carotid bruit is not present.  Cardiovascular: Normal rate, regular rhythm, normal heart sounds, intact distal pulses and normal pulses.   Pulmonary/Chest:  Breath sounds normal. No respiratory distress.  Abdominal: Soft. Bowel sounds are normal. There is no tenderness. There is no guarding.  Abscess right mid abd. No red streaks. Tender to palpation.  Musculoskeletal: Normal range of motion.  Lymphadenopathy:       Head (right side): No submandibular adenopathy present.       Head (left side): No submandibular adenopathy present.    He has no cervical adenopathy.  Neurological: He is alert and oriented to  person, place, and time. He has normal strength. No cranial nerve deficit or sensory deficit.  Skin: Skin is warm and dry.  Psychiatric: He has a normal mood and affect. His speech is normal.  Nursing note and vitals reviewed.   ED Course  .Marland KitchenIncision and Drainage Date/Time: 07/20/2015 11:25 AM Performed by: Ivery Quale Authorized by: Ivery Quale Consent: Verbal consent obtained. Risks and benefits: risks, benefits and alternatives were discussed Consent given by: patient Patient understanding: patient states understanding of the procedure being performed Patient identity confirmed: arm band Time out: Immediately prior to procedure a "time out" was called to verify the correct patient, procedure, equipment, support staff and site/side marked as required. Type: abscess Body area: trunk Location details: abdomen Anesthesia: local infiltration Local anesthetic: lidocaine 2% without epinephrine Patient sedated: no Scalpel size: 11 Incision type: single straight Incision depth: dermal Complexity: simple Drainage: purulent Drainage amount: copious Wound treatment: wound left open Patient tolerance: Patient tolerated the procedure well with no immediate complications   (including critical care time) Labs Review Labs Reviewed - No data to display  Imaging Review No results found. I have personally reviewed and evaluated these images and lab results as part of my medical decision-making.   EKG Interpretation None      MDM  Vital signs reviewed. No evidence of advanced infection. Culture of the right abdomen abscess sent to the lab. Patient will be placed on doxycycline and Norco. He will return to the emergency department if any changes, problems, or concerns.    Final diagnoses:  None    **I have reviewed nursing notes, vital signs, and all appropriate lab and imaging results for this patient.Ivery Quale, PA-C 07/22/15 2008  Blane Ohara, MD 07/23/15  2350

## 2015-07-23 LAB — CULTURE, ROUTINE-ABSCESS

## 2015-07-25 ENCOUNTER — Telehealth (HOSPITAL_COMMUNITY): Payer: Self-pay

## 2015-07-25 NOTE — Telephone Encounter (Signed)
Post ED Visit - Positive Culture Follow-up  Culture report reviewed by antimicrobial stewardship pharmacist:   Celedonio Miyamoto, Pharm.D., BCPS  Georgina Pillion, [address].D., BCPS  [place], [place].D., BCPS, AAHIVP  Estella Husk, Pharm.D., BCPS, AAHIVP  Cristy Reyes, [address].D.  Tennis Must, [place].D.  Positive abscess culture, moderate staph aureus Treated with Doxycline, organism sensitive to the same and no further patient follow-up is required at this time.  Arvid Right 07/25/2015, 3:28 AM

## 2016-05-22 ENCOUNTER — Encounter (HOSPITAL_COMMUNITY): Payer: Self-pay | Admitting: Emergency Medicine

## 2016-05-22 ENCOUNTER — Emergency Department (HOSPITAL_COMMUNITY)
Admission: EM | Admit: 2016-05-22 | Discharge: 2016-05-22 | Disposition: A | Discharge status: Home / Self Care | Payer: BLUE CROSS/BLUE SHIELD | Attending: Emergency Medicine | Admitting: Emergency Medicine

## 2016-05-22 DIAGNOSIS — Z79899 Other long term (current) drug therapy: Secondary | ICD-10-CM | POA: Insufficient documentation

## 2016-05-22 DIAGNOSIS — L02412 Cutaneous abscess of left axilla: Secondary | ICD-10-CM | POA: Diagnosis present

## 2016-05-22 MED ORDER — DOXYCYCLINE HYCLATE 100 MG PO CAPS: 100.0000 mg | ORAL_CAPSULE | Freq: Two times a day (BID) | ORAL | 0 refills | Status: DC

## 2016-05-22 MED ORDER — HYDROCODONE-ACETAMINOPHEN 5-325 MG PO TABS: ORAL_TABLET | ORAL | 0 refills | Status: DC

## 2016-05-22 MED ORDER — LIDOCAINE-EPINEPHRINE (PF) 2 %-1:200000 IJ SOLN
10.0000 mL | Freq: Once | INTRAMUSCULAR | Status: DC
  Filled 2016-05-22: qty 20

## 2016-05-22 NOTE — ED Triage Notes (Signed)
Pt reports history of same. Pt reports two abscess noted to left axilla x2 weeks. Pt reports tried otc and home comfort measures and reports sites swelling. Minimal redness noted.

## 2016-05-22 NOTE — ED Provider Notes (Signed)
[facility] DEPT Provider Note   CSN: [id number] Arrival date & time: 05/22/16  1206  First Provider Contact:  None    By signing my name below, I, Aaron Parsons, attest that this documentation has been prepared under the direction and in the presence of non-physician practitioner, Jeannia Tatro, PA-C. Electronically Signed: Majel Parsons, Scribe. 05/22/2016. 3:12 PM.  Chief Complaint Chief Complaint  Patient presents with  . Abscess   The history is provided by the patient. No language interpreter was used.   HPI Comments: ROSHON Aaron Parsons is a 30 y.o. male with PMHx of abscess, who presents to the Emergency Department complaining of gradually worsening, 4 cm area of localized induration on left axilla that appeared ~2 weeks PTA and worsened this morning. Pt reports his pain is circulating around his left shoulder. He states his pain is exacerbated during work in which he has to lift heavy objects above his head. Pt notes a hx of the same symptoms on is left axilla 3 years ago and abdominal wall in 10/16. He denies fever, chills, vomiting, or swelling of the arm   Past Medical History:  Diagnosis Date  . Abscess   . Fracture of finger of right hand    There are no active problems to display for this patient.  History reviewed. No pertinent surgical history.  Home Medications    Prior to Admission medications   Medication Sig Start Date End Date Taking? Authorizing Provider  cyclobenzaprine (FLEXERIL) 10 MG tablet Take 1 tablet (10 mg total) by mouth 2 (two) times daily as needed for muscle spasms. Patient not taking: Reported on 11/11/2014 10/21/13   Janne Napoleon, NP  doxycycline (VIBRAMYCIN) 100 MG capsule Take 1 capsule (100 mg total) by mouth 2 (two) times daily. 07/20/15   Ivery Quale, PA-C  HYDROcodone-acetaminophen (NORCO) 7.5-325 MG tablet Take 1 tablet by mouth every 4 (four) hours as needed. 07/20/15   Ivery Quale, PA-C  ibuprofen (ADVIL,MOTRIN) 600 MG tablet Take 1  tablet (600 mg total) by mouth every 6 (six) hours as needed. Patient not taking: Reported on 07/20/2015 01/29/15   Burgess Amor, PA-C  predniSONE (DELTASONE) 10 MG tablet Take 2 tablets (20 mg total) by mouth daily. Patient not taking: Reported on 11/11/2014 10/21/13   Janne Napoleon, NP  Pseudoephedrine-Naproxen Na (ALEVE-D SINUS & COLD PO) Take 1 tablet by mouth daily as needed (sinus).    Historical Provider, MD  sulfamethoxazole-trimethoprim (SEPTRA DS) 800-160 MG per tablet Take 1 tablet by mouth 2 (two) times daily. Patient not taking: Reported on 11/11/2014 05/06/13   Pauline Aus, PA-C    Family History History reviewed. No pertinent family history.  Social History Social History  Substance Use Topics  . Smoking status: Never Smoker  . Smokeless tobacco: Never Used  . Alcohol use Yes     Comment: occasional     Allergies   Review of patient's allergies indicates no known allergies.  Review of Systems Review of Systems  Constitutional: Negative for chills and fever.  Gastrointestinal: Negative for nausea and vomiting.  Musculoskeletal: Negative for arthralgias and joint swelling.  Skin: Positive for color change.       Abscess   Hematological: Negative for adenopathy.  All other systems reviewed and are negative.   Physical Exam Updated Vital Signs BP 130/94 (BP Location: Right Arm)   Pulse 70   Temp 98.6 F (37 C) (Oral)   Resp 14   Ht  (1.93 m)   Wt 200  lb (90.7 kg)   SpO2 100%   BMI 24.34 kg/m   Physical Exam  Constitutional: He is oriented to person, place, and time. He appears well-developed and well-nourished.  HENT:  Head: Normocephalic and atraumatic.  Eyes: Conjunctivae are normal. Pupils are equal, round, and reactive to light. Right eye exhibits no discharge. Left eye exhibits no discharge. No scleral icterus.  Neck: Normal range of motion. No JVD present. No tracheal deviation present.  Pulmonary/Chest: Effort normal. No stridor.  Neurological:  He is alert and oriented to person, place, and time. Coordination normal.  Skin: There is erythema.  4 cm area of induration, fluctuance and erythema on left axilla. No drainage.  Psychiatric: He has a normal mood and affect. His behavior is normal. Judgment and thought content normal.  Nursing note and vitals reviewed.  ED Treatments / Results  Labs (all labs ordered are listed, but only abnormal results are displayed) Labs Reviewed - No data to display  EKG  EKG Interpretation None       Radiology No results found.  Procedures Procedures  INCISION AND DRAINAGE Performed by: Maxwell CaulRIPLETT,Alantra Popoca L. Consent: Verbal consent obtained. Risks and benefits: risks, benefits and alternatives were discussed Type: abscess  Body area: left axilla  Anesthesia: local infiltration  Incision was made with a #11 scalpel.  Local anesthetic: lidocaine 2 % w/ epinephrine  Anesthetic total: 3 ml  Complexity: complex Blunt dissection to break up loculations  Drainage: purulent  Drainage amount: moderate  Packing material: 1/4 in iodoform gauze  Patient tolerance: Patient tolerated the procedure well with no immediate complications.    DIAGNOSTIC STUDIES:  Oxygen Saturation is 100% on RA, normal by my interpretation.    COORDINATION OF CARE:  3:10 PM Discussed treatment plan with pt at bedside and pt agreed to plan.  Medications Ordered in ED Medications  lidocaine-EPINEPHrine (XYLOCAINE W/EPI) 2 %-1:200000 (PF) injection 10 mL (not administered)   Initial Impression / Assessment and Plan / ED Course  I have reviewed the triage vital signs and the nursing notes.  Pertinent labs & imaging results that were available during my care of the patient were reviewed by me and considered in my medical decision making (see chart for details).  Clinical Course   Pt with recurrent abscesses of the axilla.  I suspect hidradenitis.  Referral given for gen surgery.  Rx for doxy. and  vicodin    I personally performed the services described in this documentation, which was scribed in my presence. The recorded information has been reviewed and is accurate.   Final Clinical Impressions(s) / ED Diagnoses   Final diagnoses:  Abscess of axilla, left   Patient with skin abscess. Incision and drainage performed in the ED today.   Wound recheck in 2 days. Supportive care and return precautions discussed.  The patient appears reasonably screened and/or stabilized for discharge and I doubt any other emergent medical condition requiring further screening, evaluation, or treatment in the ED prior to discharge.  New Prescriptions New Prescriptions   No medications on file     Pauline Ausammy Santasia Rew, PA-C 05/22/16 1718    Vanetta MuldersScott Zackowski, MD 05/24/16 [phone]

## 2016-05-22 NOTE — Discharge Instructions (Signed)
Apply warm wet compresses on/off.  Packing removal in 2 days.  Return here if needed.

## 2017-05-22 ENCOUNTER — Emergency Department (HOSPITAL_COMMUNITY)
Admission: EM | Admit: 2017-05-22 | Discharge: 2017-05-22 | Disposition: A | Discharge status: Home / Self Care | Payer: BLUE CROSS/BLUE SHIELD | Attending: Emergency Medicine | Admitting: Emergency Medicine

## 2017-05-22 ENCOUNTER — Encounter (HOSPITAL_COMMUNITY): Payer: Self-pay | Admitting: *Deleted

## 2017-05-22 DIAGNOSIS — S39012A Strain of muscle, fascia and tendon of lower back, initial encounter: Secondary | ICD-10-CM

## 2017-05-22 DIAGNOSIS — S3992XA Unspecified injury of lower back, initial encounter: Secondary | ICD-10-CM | POA: Diagnosis present

## 2017-05-22 DIAGNOSIS — Y998 Other external cause status: Secondary | ICD-10-CM | POA: Diagnosis not present

## 2017-05-22 DIAGNOSIS — Y33XXXA Other specified events, undetermined intent, initial encounter: Secondary | ICD-10-CM | POA: Insufficient documentation

## 2017-05-22 DIAGNOSIS — X500XXA Overexertion from strenuous movement or load, initial encounter: Secondary | ICD-10-CM | POA: Insufficient documentation

## 2017-05-22 DIAGNOSIS — Y9389 Activity, other specified: Secondary | ICD-10-CM | POA: Diagnosis not present

## 2017-05-22 DIAGNOSIS — Z79899 Other long term (current) drug therapy: Secondary | ICD-10-CM | POA: Insufficient documentation

## 2017-05-22 DIAGNOSIS — Y9289 Other specified places as the place of occurrence of the external cause: Secondary | ICD-10-CM | POA: Insufficient documentation

## 2017-05-22 MED ORDER — CYCLOBENZAPRINE HCL 10 MG PO TABS: 10.0000 mg | ORAL_TABLET | Freq: Three times a day (TID) | ORAL | 0 refills | Status: DC

## 2017-05-22 MED ORDER — IBUPROFEN 600 MG PO TABS: 600.0000 mg | ORAL_TABLET | Freq: Four times a day (QID) | ORAL | 0 refills | Status: DC

## 2017-05-22 NOTE — Discharge Instructions (Signed)
Please rest your back is much as possible. Use your heating pad when at rest. Use 600 mg of ibuprofen and 500 mg of Tylenol with breakfast, lunch, dinner, and at bedtime. Use Flexeril 3 times daily for spasm pain. This medication may cause drowsiness. Please do not drive a vehicle, operating machinery, drink alcohol, or participate in activities requiring concentration when taking this medication. Please see Dr. Romeo AppleHarrison for orthopedic evaluation and management if not improving.

## 2017-05-22 NOTE — ED Provider Notes (Signed)
[facility] Provider Note   CSN: [id number] Arrival date & time: 05/22/17  1156     History   Chief Complaint Chief Complaint  Patient presents with  . Back Pain    HPI Aaron Parsons is a 31 y.o. male.  Patient is a 31 year old male presents to the emergency department with a complaint of back pain.  The patient states this problem started 2 days ago. The patient states that his work involves heavy lifting. After work recently he noted pain in the lower back. This pain has been getting progressively worse. He has tried Motrin and heating pad recently but these have not been successful. On yesterday August 6 the patient tried resting his back, but continues to wake up with problems pain. He presents now for assistance with this issue. No complaint of loss of bowel or bladder function. No difficulty with use of the lower extremities. No recent falls reported.   The history is provided by the patient.    Past Medical History:  Diagnosis Date  . Abscess   . Fracture of finger of right hand     There are no active problems to display for this patient.   History reviewed. No pertinent surgical history.     Home Medications    Prior to Admission medications   Medication Sig Start Date End Date Taking? Authorizing Provider  doxycycline (VIBRAMYCIN) 100 MG capsule Take 1 capsule (100 mg total) by mouth 2 (two) times daily. For 10 days 05/22/16   Pauline Ausriplett, Tammy, PA-C  HYDROcodone-acetaminophen (NORCO/VICODIN) 5-325 MG tablet Take one-two tabs po q 4-6 hrs prn pain 05/22/16   Triplett, Tammy, PA-C  Pseudoephedrine-Naproxen Na (ALEVE-D SINUS & COLD PO) Take 1 tablet by mouth daily as needed (sinus).    [provider]    Family History No family history on file.  Social History Social History  Substance Use Topics  . Smoking status: Never Smoker  . Smokeless tobacco: Never Used  . Alcohol use Yes     Comment: occasional     Allergies     Patient has no known allergies.   Review of Systems Review of Systems  Constitutional: Negative for activity change.       All ROS Neg except as noted in HPI  HENT: Negative for nosebleeds.   Eyes: Negative for photophobia and discharge.  Respiratory: Negative for cough, shortness of breath and wheezing.   Cardiovascular: Negative for chest pain and palpitations.  Gastrointestinal: Negative for abdominal pain and blood in stool.  Genitourinary: Negative for dysuria, frequency and hematuria.  Musculoskeletal: Positive for back pain. Negative for arthralgias and neck pain.  Skin: Negative.   Neurological: Negative for dizziness, seizures and speech difficulty.  Psychiatric/Behavioral: Negative for confusion and hallucinations.     Physical Exam Updated Vital Signs BP (!) 157/102 (BP Location: Left Arm)   Pulse 78   Temp 98.5 F (36.9 C) (Oral)   Resp 18   Ht 6\' 4"  (1.93 m)   Wt 97.5 kg (215 lb)   SpO2 100%   BMI 26.17 kg/m   Physical Exam  Constitutional: He is oriented to person, place, and time. He appears well-developed and well-nourished.  Non-toxic appearance.  HENT:  Head: Normocephalic.  Right Ear: Tympanic membrane and external ear normal.  Left Ear: Tympanic membrane and external ear normal.  Eyes: Pupils are equal, round, and reactive to light. EOM and lids are normal.  Neck: Normal range of motion. Neck supple. Carotid bruit is not  present.  Cardiovascular: Normal rate, regular rhythm, normal heart sounds, intact distal pulses and normal pulses.   Pulmonary/Chest: Breath sounds normal. No respiratory distress.  Abdominal: Soft. Bowel sounds are normal. There is no tenderness. There is no guarding.  Musculoskeletal: Normal range of motion.       Lumbar back: He exhibits pain and spasm.       Back:  Lymphadenopathy:       Head (right side): No submandibular adenopathy present.       Head (left side): No submandibular adenopathy present.    He has no  cervical adenopathy.  Neurological: He is alert and oriented to person, place, and time. He has normal strength. No cranial nerve deficit or sensory deficit.  Skin: Skin is warm and dry.  Psychiatric: He has a normal mood and affect. His speech is normal.  Nursing note and vitals reviewed.    ED Treatments / Results  Labs (all labs ordered are listed, but only abnormal results are displayed) Labs Reviewed - No data to display  EKG  EKG Interpretation None       Radiology No results found.  Procedures Procedures (including critical care time)  Medications Ordered in ED Medications - No data to display   Initial Impression / Assessment and Plan / ED Course  I have reviewed the triage vital signs and the nursing notes.  Pertinent labs & imaging results that were available during my care of the patient were reviewed by me and considered in my medical decision making (see chart for details).       Final Clinical Impressions(s) / ED Diagnoses MDM Blood pressure is elevated at 157/102, otherwise the vital signs within normal limits. The examination favors muscle strain involving the lumbar region. There is no evidence for caudal equina or other emergent changes involving the back area. Patient will use low heating pad to the lower back. Prescription for ibuprofen and Flexeril given to the patient to use. The patient will follow-up with Dr. Romeo Apple for orthopedic management if not improving with these medications and rest and heating pad. Patient is in agreement with this plan.    Final diagnoses:  Strain of lumbar region, initial encounter    New Prescriptions New Prescriptions   No medications on file     Ivery Quale, Cordelia Poche 05/22/17 Linward Natal, MD 05/24/17 [phone]

## 2017-05-22 NOTE — ED Triage Notes (Signed)
Pt states he began having back pain Sunday morning after getting off work. States he does heavy lifting at work.

## 2017-05-22 NOTE — ED Notes (Signed)
Back pain started Sunday morning after working.  Took Motrin and heating pad with not relief on Sunday.  Have not taken any medication today.  Has trying resting on yesterday but woke up in pain.  Rates pain 9/10, with pain shooting down left leg to knee and upper back.

## 2017-05-22 NOTE — ED Notes (Signed)
PA and [employer]) at bedside.

## 2017-05-22 NOTE — ED Notes (Signed)
Pt instructed to see PCP regarding elevated BP.  Pt says he has a strong family history of high BP and will follow ASAP.  Educated pt on importance of monitoring BP.

## 2017-06-01 ENCOUNTER — Emergency Department (HOSPITAL_COMMUNITY)
Admission: EM | Admit: 2017-06-01 | Discharge: 2017-06-01 | Disposition: A | Discharge status: Home / Self Care | Payer: BLUE CROSS/BLUE SHIELD | Attending: Emergency Medicine | Admitting: Emergency Medicine

## 2017-06-01 ENCOUNTER — Encounter (HOSPITAL_COMMUNITY): Payer: Self-pay | Admitting: *Deleted

## 2017-06-01 DIAGNOSIS — Y990 Civilian activity done for income or pay: Secondary | ICD-10-CM | POA: Diagnosis not present

## 2017-06-01 DIAGNOSIS — X500XXD Overexertion from strenuous movement or load, subsequent encounter: Secondary | ICD-10-CM | POA: Diagnosis not present

## 2017-06-01 DIAGNOSIS — Z79899 Other long term (current) drug therapy: Secondary | ICD-10-CM | POA: Insufficient documentation

## 2017-06-01 DIAGNOSIS — S39012D Strain of muscle, fascia and tendon of lower back, subsequent encounter: Secondary | ICD-10-CM | POA: Diagnosis not present

## 2017-06-01 DIAGNOSIS — M545 Low back pain: Secondary | ICD-10-CM | POA: Diagnosis present

## 2017-06-01 MED ORDER — PREDNISONE 10 MG PO TABS: ORAL_TABLET | ORAL | 0 refills | Status: DC

## 2017-06-01 NOTE — ED Notes (Signed)
Pt states he was here last week for the same issue. Was prescribed Flexeril and Ibuprofen. Took those all last week, but has a job that has a lot of heavy lifting and states the pain is getting worse with the work he does.

## 2017-06-01 NOTE — ED Triage Notes (Signed)
Recurrent back pain, needs work note

## 2017-06-01 NOTE — ED Provider Notes (Signed)
[facility] Provider Note   CSN: [id number] Arrival date & time: 06/01/17  1134     History   Chief Complaint Chief Complaint  Patient presents with  . Back Pain    HPI Aaron Parsons is a 31 y.o. male presenting with acute low back pain.  He reports occasional problems with his lower back which he blames on repetitive lifting of boxes at work weighing up to 50lbs.  He usually can take anti inflammatories and rest a few days and his pain is improved.  This episode actually started last week, was seen here 10 days ago, placed on flexeril and ibuprofen and has applied heat but continues to have pain.  He endorses however that he has continued to work which makes symptoms worse.  He reports pain that localizes to his left lateral paralumbar area.  He denies radiation of pain and there has been no weakness or numbness in the lower extremities and no urinary or bowel retention or incontinence.  Patient does not have a history of cancer or IVDU.   Marland Kitchen  The history is provided by the patient.    Past Medical History:  Diagnosis Date  . Abscess   . Fracture of finger of right hand     There are no active problems to display for this patient.   History reviewed. No pertinent surgical history.     Home Medications    Prior to Admission medications   Medication Sig Start Date End Date Taking? Authorizing Provider  cyclobenzaprine (FLEXERIL) 10 MG tablet Take 1 tablet (10 mg total) by mouth 3 (three) times daily. 05/22/17   Ivery Quale, PA-C  doxycycline (VIBRAMYCIN) 100 MG capsule Take 1 capsule (100 mg total) by mouth 2 (two) times daily. For 10 days 05/22/16   Pauline Aus, PA-C  HYDROcodone-acetaminophen (NORCO/VICODIN) 5-325 MG tablet Take one-two tabs po q 4-6 hrs prn pain 05/22/16   Triplett, Tammy, PA-C  ibuprofen (ADVIL,MOTRIN) 600 MG tablet Take 1 tablet (600 mg total) by mouth 4 (four) times daily. 05/22/17   Ivery Quale, PA-C  predniSONE (DELTASONE) 10 MG  tablet Take 6 tablets day one, 5 tablets day two, 4 tablets day three, 3 tablets day four, 2 tablets day five, then 1 tablet day six 06/01/17   Syvanna Ciolino, Raynelle Fanning, PA-C  Pseudoephedrine-Naproxen Na (ALEVE-D SINUS & COLD PO) Take 1 tablet by mouth daily as needed (sinus).    [provider]    Family History No family history on file.  Social History Social History  Substance Use Topics  . Smoking status: Never Smoker  . Smokeless tobacco: Never Used  . Alcohol use Yes     Comment: occasional     Allergies   Patient has no known allergies.   Review of Systems Review of Systems  Constitutional: Negative for fever.  Respiratory: Negative for shortness of breath.   Cardiovascular: Negative for chest pain and leg swelling.  Gastrointestinal: Negative for abdominal distention, abdominal pain and constipation.  Genitourinary: Negative for difficulty urinating, dysuria, flank pain, frequency and urgency.  Musculoskeletal: Positive for back pain. Negative for gait problem and joint swelling.  Skin: Negative for rash.  Neurological: Negative for weakness and numbness.     Physical Exam Updated Vital Signs BP 139/88   Pulse 69   Temp 98.3 F (36.8 C)   Resp 18   Ht 6\' 4"  (1.93 m)   Wt 97.1 kg (214 lb)   SpO2 99%   BMI 26.05 kg/m  Physical Exam  Constitutional: He appears well-developed and well-nourished.  HENT:  Head: Normocephalic.  Eyes: Conjunctivae are normal.  Neck: Normal range of motion. Neck supple.  Cardiovascular: Normal rate and intact distal pulses.   Pedal pulses normal.  Pulmonary/Chest: Effort normal.  Abdominal: Soft. Bowel sounds are normal. He exhibits no distension and no mass.  Musculoskeletal: Normal range of motion. He exhibits no edema.       Lumbar back: He exhibits tenderness. He exhibits no swelling, no edema and no spasm.  Neurological: He is alert. He has normal strength. He displays no atrophy and no tremor. No sensory deficit. Gait  normal.  Reflex Scores:      Patellar reflexes are 2+ on the right side and 2+ on the left side.      Achilles reflexes are 2+ on the right side and 2+ on the left side. No strength deficit noted in hip and knee flexor and extensor muscle groups.  Ankle flexion and extension intact.  Skin: Skin is warm and dry.  Psychiatric: He has a normal mood and affect.  Nursing note and vitals reviewed.    ED Treatments / Results  Labs (all labs ordered are listed, but only abnormal results are displayed) Labs Reviewed - No data to display  EKG  EKG Interpretation None       Radiology No results found.  Procedures Procedures (including critical care time)  Medications Ordered in ED Medications - No data to display   Initial Impression / Assessment and Plan / ED Course  I have reviewed the triage vital signs and the nursing notes.  Pertinent labs & imaging results that were available during my care of the patient were reviewed by me and considered in my medical decision making (see chart for details).     No neuro deficit on exam or by history to suggest emergent or surgical presentation including cauda equina.  Also no risk factors or sx to suggest epidural abscess or diskitis.  discussed worsened sx that should prompt immediate re-evaluation including distal weakness, bowel/bladder retention/incontinence. Pt was given referral numbers for establishing pcp.  The patient appears reasonably screened and/or stabilized for discharge and I doubt any other medical condition or other [facility] requiring further screening, evaluation, or treatment in the ED at this time prior to discharge.   Final Clinical Impressions(s) / ED Diagnoses   Final diagnoses:  Strain of lumbar region, subsequent encounter    New Prescriptions Discharge Medication List as of 06/01/2017  1:36 PM    START taking these medications   Details  predniSONE (DELTASONE) 10 MG tablet Take 6 tablets day one, 5 tablets  day two, 4 tablets day three, 3 tablets day four, 2 tablets day five, then 1 tablet day six, Print         Burgess Amor, PA-C 06/03/17 2115    Burgess Amor, PA-C 06/03/17 2117    Vanetta Mulders, MD 06/13/17 1010

## 2018-05-01 ENCOUNTER — Emergency Department (HOSPITAL_COMMUNITY)
Admission: EM | Admit: 2018-05-01 | Discharge: 2018-05-01 | Disposition: A | Discharge status: Home / Self Care | Payer: BLUE CROSS/BLUE SHIELD | Attending: Emergency Medicine | Admitting: Emergency Medicine

## 2018-05-01 ENCOUNTER — Encounter (HOSPITAL_COMMUNITY): Payer: Self-pay

## 2018-05-01 DIAGNOSIS — L02411 Cutaneous abscess of right axilla: Secondary | ICD-10-CM | POA: Insufficient documentation

## 2018-05-01 MED ORDER — DOXYCYCLINE HYCLATE 100 MG PO CAPS: 100.0000 mg | ORAL_CAPSULE | Freq: Two times a day (BID) | ORAL | 0 refills | Status: AC

## 2018-05-01 MED ORDER — HYDROCODONE-ACETAMINOPHEN 5-325 MG PO TABS: 1.0000 | ORAL_TABLET | Freq: Four times a day (QID) | ORAL | 0 refills | Status: DC | PRN

## 2018-05-01 MED ORDER — ACETAMINOPHEN 500 MG PO TABS
1000.0000 mg | ORAL_TABLET | Freq: Once | ORAL | Status: AC
  Administered 2018-05-01: 1000 mg via ORAL
  Filled 2018-05-01: qty 2

## 2018-05-01 MED ORDER — POVIDONE-IODINE 10 % EX SOLN
CUTANEOUS | Status: AC
  Administered 2018-05-01: 1
  Filled 2018-05-01: qty 15

## 2018-05-01 MED ORDER — LIDOCAINE HCL (PF) 1 % IJ SOLN
INTRAMUSCULAR | Status: AC
  Administered 2018-05-01: 10 mL
  Filled 2018-05-01: qty 10

## 2018-05-01 NOTE — ED Provider Notes (Signed)
[facility] EMERGENCY DEPARTMENT Provider Note   CSN: [id number] Arrival date & time: 05/01/18  1545     History   Chief Complaint Chief Complaint  Patient presents with  . Abscess    HPI Aaron Parsons is a 32 y.o. male who presents for evaluation of pain, redness, swelling to the right axilla that began yesterday.  Patient reports he has a history of abscesses, particularly to his axilla.  Patient reports that he has had to have them drained in the ED previously.  Patient reports that he has been referred to outpatient surgery but states that he has not been able to see them.  Patient reports that his most recent symptoms began yesterday.  He states that he noticed some aching and soreness to the area that spread to his chest and lateral sidewall.  He states that his arm hurts to move it.  He states that he feels like this is the beginning of an abscess that he usually gets.  Patient reports he has not taken any medication for the pain.  Patient denies any fevers, numbness, weakness.  The history is provided by the patient.    Past Medical History:  Diagnosis Date  . Abscess   . Fracture of finger of right hand     There are no active problems to display for this patient.   History reviewed. No pertinent surgical history.      Home Medications    Prior to Admission medications   Medication Sig Start Date End Date Taking? Authorizing Provider  cyclobenzaprine (FLEXERIL) 10 MG tablet Take 1 tablet (10 mg total) by mouth 3 (three) times daily. 05/22/17   Ivery Quale, PA-C  doxycycline (VIBRAMYCIN) 100 MG capsule Take 1 capsule (100 mg total) by mouth 2 (two) times daily. For 10 days 05/22/16   Triplett, Tammy, PA-C  doxycycline (VIBRAMYCIN) 100 MG capsule Take 1 capsule (100 mg total) by mouth 2 (two) times daily for 7 days. 05/01/18 05/08/18  Maxwell Caul, PA-C  HYDROcodone-acetaminophen (NORCO/VICODIN) 5-325 MG tablet Take one-two tabs po q 4-6 hrs prn pain  05/22/16   Triplett, Tammy, PA-C  HYDROcodone-acetaminophen (NORCO/VICODIN) 5-325 MG tablet Take 1-2 tablets by mouth every 6 (six) hours as needed. 05/01/18   Maxwell Caul, PA-C  ibuprofen (ADVIL,MOTRIN) 600 MG tablet Take 1 tablet (600 mg total) by mouth 4 (four) times daily. 05/22/17   Ivery Quale, PA-C  predniSONE (DELTASONE) 10 MG tablet Take 6 tablets day one, 5 tablets day two, 4 tablets day three, 3 tablets day four, 2 tablets day five, then 1 tablet day six 06/01/17   Idol, Raynelle Fanning, PA-C  Pseudoephedrine-Naproxen Na (ALEVE-D SINUS & COLD PO) Take 1 tablet by mouth daily as needed (sinus).    [provider]    Family History No family history on file.  Social History Social History   Tobacco Use  . Smoking status: Never Smoker  . Smokeless tobacco: Never Used  Substance Use Topics  . Alcohol use: Yes    Comment: occasional  . Drug use: No     Allergies   Patient has no known allergies.   Review of Systems Review of Systems  Constitutional: Negative for fever.  Skin: Positive for color change and wound.  Neurological: Negative for numbness.     Physical Exam Updated Vital Signs BP (!) 170/113   Pulse 79   Temp 97.8 F (36.6 C) (Oral)   Resp 16   Ht 6\' 3"  (1.905 m)  Wt 104.3 kg (230 lb)   SpO2 100%   BMI 28.75 kg/m   Physical Exam  Constitutional: He appears well-developed and well-nourished.  Appears uncomfortable but no acute distress   HENT:  Head: Normocephalic and atraumatic.  Eyes: Conjunctivae and EOM are normal. Right eye exhibits no discharge. Left eye exhibits no discharge. No scleral icterus.  Pulmonary/Chest: Effort normal.  Lymphadenopathy:    He has no axillary adenopathy.  Neurological: He is alert.  Skin: Skin is warm and dry. Capillary refill takes less than 2 seconds.  Right axilla has a 4 cm area of warmth, erythema, induration with central fluctuance.  No purulent drainage noted. Good distal cap refill. RUE is not dusky  in appearance or cool to touch.  Psychiatric: He has a normal mood and affect. His speech is normal and behavior is normal.  Nursing note and vitals reviewed.    ED Treatments / Results  Labs (all labs ordered are listed, but only abnormal results are displayed) Labs Reviewed - No data to display  EKG None  Radiology No results found.  Procedures .Marland KitchenIncision and Drainage Date/Time: 05/01/2018 5:00 PM Performed by: Maxwell Caul, PA-C Authorized by: Maxwell Caul, PA-C   Consent:    Consent obtained:  Verbal   Consent given by:  Patient   Risks discussed:  Bleeding, incomplete drainage, pain and infection Location:    Type:  Abscess   Location:  Upper extremity   Upper extremity location: Right axilla. Pre-procedure details:    Skin preparation:  Betadine Anesthesia (see MAR for exact dosages):    Anesthesia method:  Local infiltration   Local anesthetic:  Lidocaine 1% w/o epi Procedure type:    Complexity:  Simple Procedure details:    Incision types:  Stab incision   Scalpel blade:  11   Wound management:  Probed and deloculated and irrigated with saline   Drainage:  Purulent   Drainage amount:  Moderate   Wound treatment:  Drain placed   Packing materials:  1/4 in gauze Post-procedure details:    Patient tolerance of procedure:  Tolerated well, no immediate complications   (including critical care time)  Medications Ordered in ED Medications  povidone-iodine (BETADINE) 10 % external solution (1 application  Given 05/01/18 1623)  lidocaine (PF) (XYLOCAINE) 1 % injection (10 mLs  Given 05/01/18 1622)  acetaminophen (TYLENOL) tablet 1,000 mg (1,000 mg Oral Given 05/01/18 1626)     Initial Impression / Assessment and Plan / ED Course  I have reviewed the triage vital signs and the nursing notes.  Pertinent labs & imaging results that were available during my care of the patient were reviewed by me and considered in my medical decision making (see chart  for details).     32 year old male who presents for evaluation of right axilla abscess that began yesterday.  Reports a history of them.  Has referred to outpatient surgery but has not followed up with them.  No fever, numbness/weakness. Patient is afebrile, non-toxic appearing, sitting comfortably on examination table. Vital signs reviewed and stable.  He is slightly hypertensive.  But otherwise vitals are stable.  This is likely secondary to pain.  Will reassess.  On exam, he has a 4 similar area of warmth, erythema with central fluctuance noted to the right axilla consistent with axilla abscess.  Bedside ultrasound used to evaluate the area and does show a small amount of fluid.  We will plan for I&D.  I&D document above.  Patient tolerated procedure  well.  We will plan to send patient home on antibiotic therapy.  Patient with no known drug allergies.  Additionally, will give a small amount of pain medication.  Patient reviewed on PMP and he has no recent narcotic prescriptions.  Wound care instructions discussed with patient.  Patient instructed return to emergency department 2 days for wound recheck. Patient had ample opportunity for questions and discussion. All patient's questions were answered with full understanding. Strict return precautions discussed. Patient expresses understanding and agreement to plan.   Final Clinical Impressions(s) / ED Diagnoses   Final diagnoses:  Abscess of axilla, right    ED Discharge Orders        Ordered    doxycycline (VIBRAMYCIN) 100 MG capsule  2 times daily     05/01/18 1647    HYDROcodone-acetaminophen (NORCO/VICODIN) 5-325 MG tablet  Every 6 hours PRN     05/01/18 1647       Maxwell CaulLayden, Lindsey A, PA-C 05/01/18 1702    Eber HongMiller, Brian, MD 05/07/18 2103

## 2018-05-01 NOTE — Discharge Instructions (Signed)
You can take 1000 mg of Tylenol.  Do not exceed 4000 mg of Tylenol a day. You can take pain medication for severe or breakthrough pain.  Follow up with the referred General Surgery for further evaluation.   Return to the Emergency Department in 2 days for wound recheck.   Take antibiotics as directed. Please take all of your antibiotics until finished.  Return to the Emergency Department if you experienced any worsening/spreading redness or swelling, fever, worsening pain, or any other worsening or concerning symptoms.

## 2018-05-01 NOTE — ED Triage Notes (Signed)
Pt reports pain under right axilla yesterday morning and now can feel knot. Pain across chest and back.

## 2018-07-30 ENCOUNTER — Emergency Department (HOSPITAL_COMMUNITY): Payer: PRIVATE HEALTH INSURANCE

## 2018-07-30 ENCOUNTER — Encounter (HOSPITAL_COMMUNITY): Payer: Self-pay

## 2018-07-30 ENCOUNTER — Other Ambulatory Visit: Payer: Self-pay

## 2018-07-30 ENCOUNTER — Emergency Department (HOSPITAL_COMMUNITY)
Admission: EM | Admit: 2018-07-30 | Discharge: 2018-07-30 | Disposition: A | Discharge status: Home / Self Care | Payer: PRIVATE HEALTH INSURANCE | Attending: Emergency Medicine | Admitting: Emergency Medicine

## 2018-07-30 DIAGNOSIS — R0981 Nasal congestion: Secondary | ICD-10-CM | POA: Diagnosis present

## 2018-07-30 DIAGNOSIS — Z79899 Other long term (current) drug therapy: Secondary | ICD-10-CM | POA: Diagnosis not present

## 2018-07-30 DIAGNOSIS — J40 Bronchitis, not specified as acute or chronic: Secondary | ICD-10-CM | POA: Insufficient documentation

## 2018-07-30 MED ORDER — AZITHROMYCIN 250 MG PO TABS: 250.0000 mg | ORAL_TABLET | Freq: Every day | ORAL | 0 refills | Status: DC

## 2018-07-30 MED ORDER — ALBUTEROL SULFATE HFA 108 (90 BASE) MCG/ACT IN AERS: 1.0000 | INHALATION_SPRAY | Freq: Four times a day (QID) | RESPIRATORY_TRACT | 0 refills | Status: DC | PRN

## 2018-07-30 MED ORDER — HYDROCODONE-HOMATROPINE 5-1.5 MG/5ML PO SYRP: 5.0000 mL | ORAL_SOLUTION | Freq: Four times a day (QID) | ORAL | 0 refills | Status: DC | PRN

## 2018-07-30 NOTE — ED Triage Notes (Signed)
Pt is having chest congestion. Started Saturday night. Has been trying to let it "run its course." Hasn't taken any medications. Has been coughing since yesterday with phlegm that was clear.

## 2018-07-30 NOTE — Discharge Instructions (Addendum)
Chest x-ray shows no pneumonia.  Prescription for inhaler, cough syrup, antibiotic.  You can use Aleve-D at home.  Increase fluids.  Tylenol for fever.  Would hold antibiotic unless symptoms worsen over the next 3-4 days.

## 2018-08-02 NOTE — ED Provider Notes (Signed)
[facility] EMERGENCY DEPARTMENT Provider Note   CSN: [id number] Arrival date & time: 07/30/18  4098     History   Chief Complaint Chief Complaint  Patient presents with  . Chest congestion    HPI Aaron Parsons is a 32 y.o. male.  Patient presents with chest congestion and cough for several days.  Productive clear sputum.  No fever, sweats, chills, rusty sputum.  Past medical history includes "sinus infections".  Non-smoker.  Severity of symptoms is mild.  Nothing makes sxs better or worse.     Past Medical History:  Diagnosis Date  . Abscess   . Fracture of finger of right hand     There are no active problems to display for this patient.   History reviewed. No pertinent surgical history.      Home Medications    Prior to Admission medications   Medication Sig Start Date End Date Taking? Authorizing Provider  albuterol (PROVENTIL HFA;VENTOLIN HFA) 108 (90 Base) MCG/ACT inhaler Inhale 1-2 puffs into the lungs every 6 (six) hours as needed for wheezing or shortness of breath. 07/30/18   Donnetta Hutching, MD  azithromycin (ZITHROMAX) 250 MG tablet Take 1 tablet (250 mg total) by mouth daily. Take first 2 tablets together, then 1 every day until finished. 07/30/18   Donnetta Hutching, MD  cyclobenzaprine (FLEXERIL) 10 MG tablet Take 1 tablet (10 mg total) by mouth 3 (three) times daily. 05/22/17   Ivery Quale, PA-C  doxycycline (VIBRAMYCIN) 100 MG capsule Take 1 capsule (100 mg total) by mouth 2 (two) times daily. For 10 days 05/22/16   Pauline Aus, PA-C  HYDROcodone-acetaminophen (NORCO/VICODIN) 5-325 MG tablet Take one-two tabs po q 4-6 hrs prn pain 05/22/16   Triplett, Tammy, PA-C  HYDROcodone-acetaminophen (NORCO/VICODIN) 5-325 MG tablet Take 1-2 tablets by mouth every 6 (six) hours as needed. 05/01/18   Maxwell Caul, PA-C  HYDROcodone-homatropine (HYCODAN) 5-1.5 MG/5ML syrup Take 5 mLs by mouth every 6 (six) hours as needed for cough. 07/30/18   Donnetta Hutching, MD   ibuprofen (ADVIL,MOTRIN) 600 MG tablet Take 1 tablet (600 mg total) by mouth 4 (four) times daily. 05/22/17   Ivery Quale, PA-C  predniSONE (DELTASONE) 10 MG tablet Take 6 tablets day one, 5 tablets day two, 4 tablets day three, 3 tablets day four, 2 tablets day five, then 1 tablet day six 06/01/17   Idol, Raynelle Fanning, PA-C  Pseudoephedrine-Naproxen Na (ALEVE-D SINUS & COLD PO) Take 1 tablet by mouth daily as needed (sinus).    [provider]    Family History No family history on file.  Social History Social History   Tobacco Use  . Smoking status: Never Smoker  . Smokeless tobacco: Never Used  Substance Use Topics  . Alcohol use: Yes    Comment: occasional  . Drug use: No     Allergies   Patient has no known allergies.   Review of Systems Review of Systems  All other systems reviewed and are negative.    Physical Exam Updated Vital Signs BP (!) 164/118 (BP Location: Right Arm)   Pulse 99   Temp 97.8 F (36.6 C) (Oral)   Resp 14   Ht 6\' 3"  (1.905 m)   Wt 104.3 kg   SpO2 99%   BMI 28.75 kg/m   Physical Exam  Constitutional: He is oriented to person, place, and time. He appears well-developed and well-nourished.  No acute distress  HENT:  Head: Normocephalic and atraumatic.  Eyes: Conjunctivae are normal.  Neck: Neck supple.  Cardiovascular: Normal rate and regular rhythm.  Pulmonary/Chest: Effort normal and breath sounds normal.  Abdominal: Soft. Bowel sounds are normal.  Musculoskeletal: Normal range of motion.  Neurological: He is alert and oriented to person, place, and time.  Skin: Skin is warm and dry.  Psychiatric: He has a normal mood and affect. His behavior is normal.  Nursing note and vitals reviewed.    ED Treatments / Results  Labs (all labs ordered are listed, but only abnormal results are displayed) Labs Reviewed - No data to display  EKG None  Radiology No results found.  Procedures Procedures (including critical care  time)  Medications Ordered in ED Medications - No data to display   Initial Impression / Assessment and Plan / ED Course  I have reviewed the triage vital signs and the nursing notes.  Pertinent labs & imaging results that were available during my care of the patient were reviewed by me and considered in my medical decision making (see chart for details).     History and physical most consistent with a viral URI.  Discharge medications albuterol inhaler and Hycodan cough syrup.  I also prescribed Zithromax, but I recommended he hold the prescription for several days unless symptoms worsen.  Final Clinical Impressions(s) / ED Diagnoses   Final diagnoses:  Bronchitis    ED Discharge Orders         Ordered    albuterol (PROVENTIL HFA;VENTOLIN HFA) 108 (90 Base) MCG/ACT inhaler  Every 6 hours PRN     07/30/18 1140    HYDROcodone-homatropine (HYCODAN) 5-1.5 MG/5ML syrup  Every 6 hours PRN     07/30/18 1140    azithromycin (ZITHROMAX) 250 MG tablet  Daily     07/30/18 1140           Donnetta Hutching, MD 08/02/18 1153

## 2019-04-16 ENCOUNTER — Encounter (HOSPITAL_COMMUNITY): Payer: Self-pay

## 2019-04-16 ENCOUNTER — Emergency Department (HOSPITAL_COMMUNITY)
Admission: EM | Admit: 2019-04-16 | Discharge: 2019-04-16 | Disposition: A | Discharge status: Home / Self Care | Payer: PRIVATE HEALTH INSURANCE | Attending: Emergency Medicine | Admitting: Emergency Medicine

## 2019-04-16 ENCOUNTER — Other Ambulatory Visit: Payer: Self-pay

## 2019-04-16 DIAGNOSIS — L02412 Cutaneous abscess of left axilla: Secondary | ICD-10-CM | POA: Insufficient documentation

## 2019-04-16 MED ORDER — ONDANSETRON HCL 4 MG PO TABS
4.0000 mg | ORAL_TABLET | Freq: Once | ORAL | Status: AC
  Administered 2019-04-16: 4 mg via ORAL
  Filled 2019-04-16: qty 1

## 2019-04-16 MED ORDER — DOXYCYCLINE HYCLATE 100 MG PO CAPS: 100.0000 mg | ORAL_CAPSULE | Freq: Two times a day (BID) | ORAL | 0 refills | Status: DC

## 2019-04-16 MED ORDER — TRAMADOL HCL 50 MG PO TABS: 50.0000 mg | ORAL_TABLET | Freq: Four times a day (QID) | ORAL | 0 refills | Status: DC | PRN

## 2019-04-16 MED ORDER — DOXYCYCLINE HYCLATE 100 MG PO TABS
100.0000 mg | ORAL_TABLET | Freq: Once | ORAL | Status: AC
  Administered 2019-04-16: 10:00:00 100 mg via ORAL
  Filled 2019-04-16: qty 1

## 2019-04-16 MED ORDER — IBUPROFEN 800 MG PO TABS
800.0000 mg | ORAL_TABLET | Freq: Once | ORAL | Status: AC
  Administered 2019-04-16: 800 mg via ORAL
  Filled 2019-04-16: qty 1

## 2019-04-16 NOTE — ED Provider Notes (Signed)
[facility] EMERGENCY DEPARTMENT Provider Note   CSN: [id number] Arrival date & time: 04/16/19  5784     History   Chief Complaint Chief Complaint  Patient presents with  . Abscess    HPI DAY DEERY is a 33 y.o. male.     Patient is a 33 year old male who presents to the emergency department with an abscess under the left arm.  The patient states that he has recurrent abscess in the right underarm area.  This particular episode started on 4 days ago with swelling.  On yesterday the area started bleeding, on last evening the patient noted some blood and pus like drainage from the area.  After this the abscess area was no longer hard and tight.  The patient still felt pain and pressure at the site, he applied a warm compress to the area, and thought he should come in to ensure that this was not getting worse instead of getting better.  No fever, no chills, no nausea, and no vomiting reported.  The patient has not seen any red streaks going up the arm.  Is been no difficulty with use of the left upper extremity.  The history is provided by the patient.  Abscess   Past Medical History:  Diagnosis Date  . Abscess   . Fracture of finger of right hand     There are no active problems to display for this patient.   History reviewed. No pertinent surgical history.      Home Medications    Prior to Admission medications   Medication Sig Start Date End Date Taking? Authorizing Provider  albuterol (PROVENTIL HFA;VENTOLIN HFA) 108 (90 Base) MCG/ACT inhaler Inhale 1-2 puffs into the lungs every 6 (six) hours as needed for wheezing or shortness of breath. 07/30/18  Yes Nat Christen, MD  ibuprofen (ADVIL,MOTRIN) 600 MG tablet Take 1 tablet (600 mg total) by mouth 4 (four) times daily. 05/22/17  Yes Lily Kocher, PA-C  Pseudoephedrine-Naproxen Na (ALEVE-D SINUS & COLD PO) Take 1 tablet by mouth daily as needed (sinus).   Yes [provider]  azithromycin  (ZITHROMAX) 250 MG tablet Take 1 tablet (250 mg total) by mouth daily. Take first 2 tablets together, then 1 every day until finished. Patient not taking: Reported on 04/16/2019 07/30/18   Nat Christen, MD  cyclobenzaprine (FLEXERIL) 10 MG tablet Take 1 tablet (10 mg total) by mouth 3 (three) times daily. Patient not taking: Reported on 04/16/2019 05/22/17   Lily Kocher, PA-C  doxycycline (VIBRAMYCIN) 100 MG capsule Take 1 capsule (100 mg total) by mouth 2 (two) times daily. For 10 days Patient not taking: Reported on 04/16/2019 05/22/16   Kem Parkinson, PA-C  HYDROcodone-acetaminophen (NORCO/VICODIN) 5-325 MG tablet Take one-two tabs po q 4-6 hrs prn pain Patient not taking: Reported on 04/16/2019 05/22/16   Triplett, Lynelle Smoke, PA-C  HYDROcodone-acetaminophen (NORCO/VICODIN) 5-325 MG tablet Take 1-2 tablets by mouth every 6 (six) hours as needed. Patient not taking: Reported on 04/16/2019 05/01/18   Volanda Napoleon, PA-C  HYDROcodone-homatropine [facility]) 5-1.5 MG/5ML syrup Take 5 mLs by mouth every 6 (six) hours as needed for cough. Patient not taking: Reported on 04/16/2019 07/30/18   Nat Christen, MD  predniSONE (DELTASONE) 10 MG tablet Take 6 tablets day one, 5 tablets day two, 4 tablets day three, 3 tablets day four, 2 tablets day five, then 1 tablet day six Patient not taking: Reported on 04/16/2019 06/01/17   Evalee Jefferson, PA-C    Family History No family  history on file.  Social History Social History   Tobacco Use  . Smoking status: Never Smoker  . Smokeless tobacco: Never Used  Substance Use Topics  . Alcohol use: Yes    Comment: occasional  . Drug use: No     Allergies   Patient has no known allergies.   Review of Systems Review of Systems  Constitutional: Negative for activity change.       All ROS Neg except as noted in HPI  HENT: Negative.   Eyes: Negative for photophobia and discharge.  Respiratory: Negative for cough, shortness of breath and wheezing.   Cardiovascular: Negative  for chest pain and palpitations.  Gastrointestinal: Negative for abdominal pain and blood in stool.  Genitourinary: Negative for dysuria, frequency and hematuria.  Musculoskeletal: Negative for arthralgias, back pain and neck pain.  Skin: Negative.        Abscess  Neurological: Negative for dizziness, seizures and speech difficulty.  Psychiatric/Behavioral: Negative for confusion and hallucinations.     Physical Exam Updated Vital Signs BP (!) 164/116 (BP Location: Left Arm)   Pulse 79   Temp 98.8 F (37.1 C) (Oral)   Resp 15   Ht 6\' 4"  (1.93 m)   Wt 108.9 kg   SpO2 99%   BMI 29.21 kg/m   Physical Exam Vitals signs and nursing note reviewed.  Constitutional:      Appearance: He is well-developed. He is not toxic-appearing.  HENT:     Head: Normocephalic.     Right Ear: Tympanic membrane and external ear normal.     Left Ear: Tympanic membrane and external ear normal.  Eyes:     General: Lids are normal.     Pupils: Pupils are equal, round, and reactive to light.  Neck:     Musculoskeletal: Normal range of motion and neck supple.     Vascular: No carotid bruit.  Cardiovascular:     Rate and Rhythm: Normal rate and regular rhythm.     Pulses: Normal pulses.     Heart sounds: Normal heart sounds.  Pulmonary:     Effort: No respiratory distress.     Breath sounds: Normal breath sounds.  Abdominal:     General: Bowel sounds are normal.     Palpations: Abdomen is soft.     Tenderness: There is no abdominal tenderness. There is no guarding.  Musculoskeletal: Normal range of motion.  Lymphadenopathy:     Head:     Right side of head: No submandibular adenopathy.     Left side of head: No submandibular adenopathy.     Cervical: No cervical adenopathy.  Skin:    General: Skin is warm and dry.     Comments: Small abscess area of the left axilla.  This is in the area and adjacent to scar tissue from her previous incision and drainage area.  There are no red streaks  appreciated.  There is minimal tenderness to the site.  There are no palpable nodes in the axilla or in the bicep tricep area.  No fluctuance noted.  Neurological:     Mental Status: He is alert and oriented to person, place, and time.     Cranial Nerves: No cranial nerve deficit.     Sensory: No sensory deficit.  Psychiatric:        Speech: Speech normal.      ED Treatments / Results  Labs (all labs ordered are listed, but only abnormal results are displayed) Labs Reviewed - No data  to display  EKG None  Radiology No results found.  Procedures Procedures (including critical care time)  Medications Ordered in ED Medications  ibuprofen (ADVIL) tablet 800 mg (has no administration in time range)  ondansetron (ZOFRAN) tablet 4 mg (has no administration in time range)  doxycycline (VIBRA-TABS) tablet 100 mg (has no administration in time range)     Initial Impression / Assessment and Plan / ED Course  I have reviewed the triage vital signs and the nursing notes.  Pertinent labs & imaging results that were available during my care of the patient were reviewed by me and considered in my medical decision making (see chart for details).          Final Clinical Impressions(s) / ED Diagnoses MDM  Blood pressure is elevated at 164/116.  I have asked the patient to have this rechecked.  Remainder the vital signs within normal limits.  Pulse oximetry is 99% on room air.  Within normal limits by my interpretation.  The patient has a small red area abscess in the left axilla.  There is no fluctuance noted at this time.  No red streaks appreciated.  This area is not a candidate for incision and drainage at this time.  The plan at this time will be for the patient use warm Epson salt soaks, and antibiotics.  The patient is to see the primary physician or return to the emergency department if any changes in his condition, problems, or concerns.  Patient is in agreement with this plan.    Final diagnoses:  Abscess of left axilla    ED Discharge Orders         Ordered    doxycycline (VIBRAMYCIN) 100 MG capsule  2 times daily     04/16/19 1013    traMADol (ULTRAM) 50 MG tablet  Every 6 hours PRN     04/16/19 1013           Ivery QualeBryant, Janaki Exley, PA-C 04/16/19 1016    Samuel JesterMcManus, Kathleen, [place]DO 04/19/19 [phone]

## 2019-04-16 NOTE — ED Triage Notes (Signed)
Pt has an abscess under his left underarm. This is a recurrent situation for him. States he noticed swelling on Friday. NAD. No fevers

## 2019-04-16 NOTE — Discharge Instructions (Addendum)
Please use warm Epson salt tub soaks for about 10 to 15 minutes daily until the abscess resolves.  Please use doxycycline 2 times daily.  Please use 600 mg of ibuprofen with breakfast, lunch, dinner, and at bedtime.  May use Ultram for more severe pain.This medication may cause drowsiness. Please do not drink, drive, or participate in activity that requires concentration while taking this medication.  Please see your primary physician or return to the emergency department if any worsening of your symptoms, changes in your condition, problems, or concerns.

## 2019-06-02 ENCOUNTER — Other Ambulatory Visit: Payer: Self-pay

## 2019-06-02 ENCOUNTER — Emergency Department (HOSPITAL_COMMUNITY)
Admission: EM | Admit: 2019-06-02 | Discharge: 2019-06-02 | Disposition: A | Discharge status: Home / Self Care | Payer: Self-pay | Attending: Emergency Medicine | Admitting: Emergency Medicine

## 2019-06-02 ENCOUNTER — Encounter (HOSPITAL_COMMUNITY): Payer: Self-pay | Admitting: *Deleted

## 2019-06-02 DIAGNOSIS — L03012 Cellulitis of left finger: Secondary | ICD-10-CM | POA: Insufficient documentation

## 2019-06-02 DIAGNOSIS — Z79899 Other long term (current) drug therapy: Secondary | ICD-10-CM | POA: Insufficient documentation

## 2019-06-02 MED ORDER — LIDOCAINE HCL (PF) 1 % IJ SOLN
INTRAMUSCULAR | Status: AC
  Filled 2019-06-02: qty 5

## 2019-06-02 MED ORDER — CEPHALEXIN 500 MG PO CAPS: 500.0000 mg | ORAL_CAPSULE | Freq: Three times a day (TID) | ORAL | 0 refills | Status: DC

## 2019-06-02 MED ORDER — LIDOCAINE HCL (PF) 2 % IJ SOLN: 5.0000 mL | Freq: Once | INTRAMUSCULAR | Status: DC

## 2019-06-02 MED ORDER — POVIDONE-IODINE 10 % EX SOLN
CUTANEOUS | Status: DC | PRN
  Filled 2019-06-02: qty 15

## 2019-06-02 NOTE — Discharge Instructions (Addendum)
Warm water soaks 2-3 times a day. Keep the finger bandaged until the area begins to heal.  Take the antibiotic as directed until finished.  Return here for any worsening symptoms

## 2019-06-02 NOTE — ED Triage Notes (Signed)
L ring finger paronychia  TT in to assess

## 2019-06-02 NOTE — ED Provider Notes (Signed)
[facility]ENCY [facility] Provider Note   CSN: [id number] Arrival date & time: 06/02/19  1548     History   Chief Complaint Chief Complaint  Patient presents with  . Hand Pain    HPI Aaron Parsons is a 33 y.o. male.     HPI   Aaron Parsons is a 33 y.o. male who presents to the Emergency Department complaining of redness, pain and swelling near the cuticle of the left ring finger.  Symptoms have been gradually worsening for several days.  He recalls a possible tear of a hangnail.  He describes a throbbing sensation of the distal finger that is constant.  He denies injury or color changes of the nail.  Denies biting his fingernails.  Denies pain or swelling to the proximal finger.     Past Medical History:  Diagnosis Date  . Abscess   . Fracture of finger of right hand     There are no active problems to display for this patient.   History reviewed. No pertinent surgical history.    Home Medications    Prior to Admission medications   Medication Sig Start Date End Date Taking? Authorizing Provider  albuterol (PROVENTIL HFA;VENTOLIN HFA) 108 (90 Base) MCG/ACT inhaler Inhale 1-2 puffs into the lungs every 6 (six) hours as needed for wheezing or shortness of breath. 07/30/18   Donnetta Hutchingook, Brian, MD  azithromycin (ZITHROMAX) 250 MG tablet Take 1 tablet (250 mg total) by mouth daily. Take first 2 tablets together, then 1 every day until finished. Patient not taking: Reported on 04/16/2019 07/30/18   Donnetta Hutchingook, Brian, MD  cyclobenzaprine (FLEXERIL) 10 MG tablet Take 1 tablet (10 mg total) by mouth 3 (three) times daily. Patient not taking: Reported on 04/16/2019 05/22/17   Ivery QualeBryant, Hobson, PA-C  doxycycline (VIBRAMYCIN) 100 MG capsule Take 1 capsule (100 mg total) by mouth 2 (two) times daily. 04/16/19   Ivery QualeBryant, Hobson, PA-C  HYDROcodone-acetaminophen (NORCO/VICODIN) 5-325 MG tablet Take one-two tabs po q 4-6 hrs prn pain Patient not taking: Reported on 04/16/2019  05/22/16   Shonette Rhames, PA-C  HYDROcodone-acetaminophen (NORCO/VICODIN) 5-325 MG tablet Take 1-2 tablets by mouth every 6 (six) hours as needed. Patient not taking: Reported on 04/16/2019 05/01/18   Maxwell CaulLayden, Lindsey A, PA-C  HYDROcodone-homatropine [facility](HYCODAN) 5-1.5 MG/5ML syrup Take 5 mLs by mouth every 6 (six) hours as needed for cough. Patient not taking: Reported on 04/16/2019 07/30/18   Donnetta Hutchingook, Brian, MD  ibuprofen (ADVIL,MOTRIN) 600 MG tablet Take 1 tablet (600 mg total) by mouth 4 (four) times daily. 05/22/17   Ivery QualeBryant, Hobson, PA-C  predniSONE (DELTASONE) 10 MG tablet Take 6 tablets day one, 5 tablets day two, 4 tablets day three, 3 tablets day four, 2 tablets day five, then 1 tablet day six Patient not taking: Reported on 04/16/2019 06/01/17   Burgess AmorIdol, Julie, PA-C  Pseudoephedrine-Naproxen Na (ALEVE-D SINUS & COLD PO) Take 1 tablet by mouth daily as needed (sinus).    [provider]  traMADol (ULTRAM) 50 MG tablet Take 1 tablet (50 mg total) by mouth every 6 (six) hours as needed. 04/16/19   Ivery QualeBryant, Hobson, PA-C    Family History History reviewed. No pertinent family history.  Social History Social History   Tobacco Use  . Smoking status: Never Smoker  . Smokeless tobacco: Never Used  Substance Use Topics  . Alcohol use: Yes    Comment: occasional  . Drug use: No     Allergies   Patient has no known allergies.  Review of Systems Review of Systems  Constitutional: Negative for chills and fever.  Musculoskeletal: Positive for arthralgias (pain, redness and swelling of the distal left ring finger). Negative for joint swelling.  Skin: Positive for color change (redness of the skin around the nail). Negative for rash and wound.  Neurological: Negative for weakness and numbness.     Physical Exam Updated Vital Signs BP (!) 156/119 (BP Location: Right Arm)   Pulse 85   Temp 98.3 F (36.8 C) (Oral)   Resp 14   Ht 6\' 4"  (1.93 m)   Wt 104.3 kg   SpO2 99%   BMI 28.00 kg/m    Physical Exam Vitals signs and nursing note reviewed.  Constitutional:      General: He is not in acute distress.    Appearance: Normal appearance. He is well-developed.  HENT:     Head: Atraumatic.  Cardiovascular:     Rate and Rhythm: Normal rate and regular rhythm.  Pulmonary:     Effort: Pulmonary effort is normal.     Breath sounds: Normal breath sounds.  Musculoskeletal:        General: Tenderness present.     Comments: Focal erythema, edema of the radial aspect of the left ring finger near the eponychium  Skin:    General: Skin is warm.     Capillary Refill: Capillary refill takes less than 2 seconds.  Neurological:     General: No focal deficit present.     Mental Status: He is alert and oriented to person, place, and time.     Sensory: No sensory deficit.     Motor: No weakness or abnormal muscle tone.      ED Treatments / Results  Labs (all labs ordered are listed, but only abnormal results are displayed) Labs Reviewed - No data to display  EKG None  Radiology No results found.  Procedures Drain paronychia  Date/Time: 06/02/2019 4:17 PM Performed by: Kem Parkinson, PA-C Authorized by: Kem Parkinson, PA-C  Consent: Verbal consent obtained. Risks and benefits: risks, benefits and alternatives were discussed Consent given by: patient Patient understanding: patient states understanding of the procedure being performed Patient identity confirmed: verbally with patient and arm band Time out: Immediately prior to procedure a "time out" was called to verify the correct patient, procedure, equipment, support staff and site/side marked as required. Preparation: Patient was prepped and draped in the usual sterile fashion. Local anesthesia used: yes Anesthesia: digital block  Anesthesia: Local anesthesia used: yes Local Anesthetic: lidocaine 2% without epinephrine Anesthetic total: 2 mL  Sedation: Patient sedated: no  Patient tolerance: patient  tolerated the procedure well with no immediate complications    (including critical care time)  Medications Ordered in ED Medications - No data to display   Initial Impression / Assessment and Plan / ED Course  I have reviewed the triage vital signs and the nursing notes.  Pertinent labs & imaging results that were available during my care of the patient were reviewed by me and considered in my medical decision making (see chart for details).        Pt well appearing.  Paronchyia of the ring finger.  NV intact.  Successful I&D.  Pain improved.   Finger bandaged.  rx written for abx, pt agrees to warm water soaks and f/u if needed.      Final Clinical Impressions(s) / ED Diagnoses   Final diagnoses:  Paronychia of finger of left hand    ED Discharge Orders  None       Pauline Ausriplett, Marites Nath, PA-C 06/02/19 1709    Mancel BaleWentz, Elliott, MD 06/03/19 1235

## 2019-06-02 NOTE — ED Triage Notes (Signed)
Pt with swelling and painful to left ring finger next to nail.   denies any injury

## 2019-12-23 ENCOUNTER — Emergency Department (HOSPITAL_COMMUNITY): Payer: Commercial Managed Care - PPO

## 2019-12-23 ENCOUNTER — Other Ambulatory Visit: Payer: Self-pay

## 2019-12-23 ENCOUNTER — Emergency Department (HOSPITAL_COMMUNITY)
Admission: EM | Admit: 2019-12-23 | Discharge: 2019-12-23 | Disposition: A | Discharge status: Home / Self Care | Payer: Commercial Managed Care - PPO | Attending: Emergency Medicine | Admitting: Emergency Medicine

## 2019-12-23 ENCOUNTER — Encounter (HOSPITAL_COMMUNITY): Payer: Self-pay | Admitting: Emergency Medicine

## 2019-12-23 DIAGNOSIS — R4781 Slurred speech: Secondary | ICD-10-CM | POA: Insufficient documentation

## 2019-12-23 DIAGNOSIS — R079 Chest pain, unspecified: Secondary | ICD-10-CM | POA: Insufficient documentation

## 2019-12-23 DIAGNOSIS — I10 Essential (primary) hypertension: Secondary | ICD-10-CM | POA: Diagnosis not present

## 2019-12-23 DIAGNOSIS — R519 Headache, unspecified: Secondary | ICD-10-CM | POA: Diagnosis present

## 2019-12-23 DIAGNOSIS — G43001 Migraine without aura, not intractable, with status migrainosus: Secondary | ICD-10-CM | POA: Diagnosis not present

## 2019-12-23 LAB — COMPREHENSIVE METABOLIC PANEL
ALT: 21 U/L (ref 0–44)
AST: 16 U/L (ref 15–41)
Albumin: 4.7 g/dL (ref 3.5–5.0)
Alkaline Phosphatase: 57 U/L (ref 38–126)
Anion gap: 8 (ref 5–15)
BUN: 14 mg/dL (ref 6–20)
CO2: 24 mmol/L (ref 22–32)
Calcium: 9.5 mg/dL (ref 8.9–10.3)
Chloride: 105 mmol/L (ref 98–111)
Creatinine, Ser: 1.1 mg/dL (ref 0.61–1.24)
GFR calc Af Amer: >60 mL/min (ref >60)
GFR calc non Af Amer: >60 mL/min (ref >60)
Glucose, Bld: 92 mg/dL (ref 70–99)
Potassium: 4 mmol/L (ref 3.5–5.1)
Sodium: 137 mmol/L (ref 135–145)
Total Bilirubin: 0.6 mg/dL (ref 0.3–1.2)
Total Protein: 8.2 g/dL — ABNORMAL HIGH (ref 6.5–8.1)

## 2019-12-23 LAB — CBC WITH DIFFERENTIAL/PLATELET
Abs Immature Granulocytes: 0.01 10*3/uL (ref 0.00–0.07)
Basophils Absolute: 0 10*3/uL (ref 0.0–0.1)
Basophils Relative: 1 %
Eosinophils Absolute: 0.1 10*3/uL (ref 0.0–0.5)
Eosinophils Relative: 1 %
HCT: 47.7 % (ref 39.0–52.0)
Hemoglobin: 16.3 g/dL (ref 13.0–17.0)
Immature Granulocytes: 0 %
Lymphocytes Relative: 34 %
Lymphs Abs: 2.8 10*3/uL (ref 0.7–4.0)
MCH: 30.5 pg (ref 26.0–34.0)
MCHC: 34.2 g/dL (ref 30.0–36.0)
MCV: 89.3 fL (ref 80.0–100.0)
Monocytes Absolute: 0.5 10*3/uL (ref 0.1–1.0)
Monocytes Relative: 6 %
Neutro Abs: 4.7 10*3/uL (ref 1.7–7.7)
Neutrophils Relative %: 58 %
Platelets: 228 10*3/uL (ref 150–400)
RBC: 5.34 MIL/uL (ref 4.22–5.81)
RDW: 13.7 % (ref 11.5–15.5)
WBC: 8.1 10*3/uL (ref 4.0–10.5)
nRBC: 0 % (ref 0.0–0.2)

## 2019-12-23 LAB — URINALYSIS, ROUTINE W REFLEX MICROSCOPIC
Bilirubin Urine: NEGATIVE
Glucose, UA: NEGATIVE mg/dL
Hgb urine dipstick: NEGATIVE
Ketones, ur: NEGATIVE mg/dL
Leukocytes,Ua: NEGATIVE
Nitrite: NEGATIVE
Protein, ur: NEGATIVE mg/dL
Specific Gravity, Urine: 1.018 (ref 1.005–1.030)
pH: 5 (ref 5.0–8.0)

## 2019-12-23 MED ORDER — LISINOPRIL 20 MG PO TABS: 20.0000 mg | ORAL_TABLET | Freq: Every day | ORAL | 0 refills | Status: DC

## 2019-12-23 NOTE — ED Triage Notes (Signed)
C/o tension headache started this am, throbbing and pounding.  rating ain 8-9/10.  LKW 2130 and woke at 0615 this am with headache and weakness.

## 2019-12-23 NOTE — ED Notes (Signed)
Patient transported to CT 

## 2019-12-23 NOTE — Consult Note (Signed)
[employer] [employer] Tele[facility]rology Consult Services  Stat Consult  Date of Service:   12/23/2019 13:17:44  Impression:     .  G43.8 - Other migraine  Comments/Sign-Out: Patient with new onset of migrainous headache. No family history. Would recommend MRI brain to rule out malignancy but this can be done as an outpatient if he continues to improve with the headache. Follow up with neurology outpatient.  CT HEAD: Showed No Acute Hemorrhage or Acute Core Infarct IMPRESSION: Normal CT of the brain   Several small ill-defined lesions in the frontal bone are nonspecific. These may be benign lesions however myeloma or metastatic disease could have this appearance.  Metrics: [employer] Notification Time: 12/23/2019 13:16:03 Stamp Time: 12/23/2019 13:17:44 Callback Response Time: 12/23/2019 13:18:35  Our recommendations are outlined below.  Recommendations:     .  Benadryl 25mg  IV and Reglan 10mg  IV  Imaging Studies:     .  MRI Head with and Without Contrast  Disposition: Sign Off  Sign Out:     .  Discussed with Emergency Department Provider  ----------------------------------------------------------------------------------------------------  Chief Complaint: altered mental status  History of Present Illness: Patient is a 34 year old Male.  34 yo M who is presenting with 2-3 hours of confusion and difficulty speaking that has resolved. Patient states that he had a mild headache. He felt like his eyes weren't adjusting right. He felt drained and fatigued. Then he got a pounding headache. His BP was 188/121 and he sat down for a bit and then he came in. The throbbing has improved and only a mild headache now. He had photophobia and phonophobia. He had some nausea and dizziness. No family history of migraines. Current pain is 4-5/10. The pain was a 9/10 at it's worst.   Past Medical History:     . Hypertension     . There is NO history of  Stroke  Anticoagulant use:  No  Antiplatelet use: No    Examination: BP(157/117), Pulse(60),  Neuro Exam:  General: Alert,Awake, Oriented to Time, Place, Person  Speech: Fluent:  Language: Intact:  Face: Symmetric:  Facial Sensation: Intact:  Visual Fields: Intact:  Extraocular Movements: Intact:  Motor Exam: No Drift:  Coordination: Intact:      Dr 31   [employer] [phone]  Case Ginette Pitman

## 2019-12-23 NOTE — Discharge Instructions (Addendum)
Follow-up with Dr. Ellin Saba in 1 week.  They should call for the appointment.  You will also get called to see what time you get your MRI of your brain

## 2019-12-23 NOTE — ED Provider Notes (Signed)
[facility] EMERGENCY DEPARTMENT Provider Note   CSN: [id number] Arrival date & time: 12/23/19  1151  An emergency department physician performed an initial assessment on this suspected stroke patient at 1215(spoke to [facility]).  History Chief Complaint  Patient presents with  . Hypertension    Aaron Parsons is a 34 y.o. male.  Patient presented with history of a headache today and he also had some slurred speech for couple hours.  Patient feels fine now.  Patient has not been followed by Dr.  He took his own blood pressure and it was elevated  The history is provided by the patient. No language interpreter was used.  Hypertension This is a new problem. The current episode started 12 to 24 hours ago. The problem occurs rarely. The problem has been resolved. Associated symptoms include chest pain. Pertinent negatives include no abdominal pain and no headaches. Nothing aggravates the symptoms. Nothing relieves the symptoms.       Past Medical History:  Diagnosis Date  . Abscess   . Fracture of finger of right hand     There are no problems to display for this patient.   History reviewed. No pertinent surgical history.     History reviewed. No pertinent family history.  Social History   Tobacco Use  . Smoking status: Never Smoker  . Smokeless tobacco: Never Used  Substance Use Topics  . Alcohol use: Yes    Comment: occasional  . Drug use: No    Home Medications Prior to Admission medications   Medication Sig Start Date End Date Taking? Authorizing Provider  albuterol (PROVENTIL HFA;VENTOLIN HFA) 108 (90 Base) MCG/ACT inhaler Inhale 1-2 puffs into the lungs every 6 (six) hours as needed for wheezing or shortness of breath. 07/30/18   Donnetta Hutching, MD  azithromycin (ZITHROMAX) 250 MG tablet Take 1 tablet (250 mg total) by mouth daily. Take first 2 tablets together, then 1 every day until finished. Patient not taking: Reported on 04/16/2019 07/30/18   Donnetta Hutching, MD  cephALEXin (KEFLEX) 500 MG capsule Take 1 capsule (500 mg total) by mouth 3 (three) times daily. 06/02/19   Triplett, Tammy, PA-C  doxycycline (VIBRAMYCIN) 100 MG capsule Take 1 capsule (100 mg total) by mouth 2 (two) times daily. Patient not taking: Reported on 06/02/2019 04/16/19   Ivery Quale, PA-C  HYDROcodone-acetaminophen (NORCO/VICODIN) 5-325 MG tablet Take one-two tabs po q 4-6 hrs prn pain Patient not taking: Reported on 06/02/2019 05/22/16   Triplett, Tammy, PA-C  lisinopril (ZESTRIL) 20 MG tablet Take 1 tablet (20 mg total) by mouth daily. 12/23/19   Bethann Berkshire, MD    Allergies    Patient has no known allergies.  Review of Systems   Review of Systems  Constitutional: Negative for appetite change and fatigue.  HENT: Negative for congestion, ear discharge and sinus pressure.   Eyes: Negative for discharge.  Respiratory: Negative for cough.   Cardiovascular: Positive for chest pain.  Gastrointestinal: Negative for abdominal pain and diarrhea.  Genitourinary: Negative for frequency and hematuria.  Musculoskeletal: Negative for back pain.  Skin: Negative for rash.  Neurological: Negative for seizures and headaches.       Slurred speech  Psychiatric/Behavioral: Negative for hallucinations.    Physical Exam Updated Vital Signs BP (!) 157/117   Pulse 60   Temp 98.2 F (36.8 C) (Oral)   Resp 16   Ht 6\' 4"  (1.93 m)   Wt 104.3 kg   SpO2 100%   BMI 28.00 kg/m  Physical Exam Vitals and nursing note reviewed.  Constitutional:      Appearance: He is well-developed.  HENT:     Head: Normocephalic.     Nose: Nose normal.  Eyes:     General: No scleral icterus.    Conjunctiva/sclera: Conjunctivae normal.  Neck:     Thyroid: No thyromegaly.  Cardiovascular:     Rate and Rhythm: Normal rate and regular rhythm.     Heart sounds: No murmur. No friction rub. No gallop.   Pulmonary:     Breath sounds: No stridor. No wheezing or rales.  Chest:     Chest wall: No  tenderness.  Abdominal:     General: There is no distension.     Tenderness: There is no abdominal tenderness. There is no rebound.  Musculoskeletal:        General: Normal range of motion.     Cervical back: Neck supple.  Lymphadenopathy:     Cervical: No cervical adenopathy.  Skin:    Findings: No erythema or rash.  Neurological:     Mental Status: He is oriented to person, place, and time.     Motor: No abnormal muscle tone.     Coordination: Coordination normal.  Psychiatric:        Behavior: Behavior normal.     ED Results / Procedures / Treatments   Labs (all labs ordered are listed, but only abnormal results are displayed) Labs Reviewed  COMPREHENSIVE METABOLIC PANEL - Abnormal; Notable for the following components:      Result Value   Total Protein 8.2 (*)    All other components within normal limits  CBC WITH DIFFERENTIAL/PLATELET  URINALYSIS, ROUTINE W REFLEX MICROSCOPIC  PROTEIN ELECTROPHORESIS, SERUM  IMMUNOFIXATION ELECTROPHORESIS  KAPPA/LAMBDA LIGHT CHAINS    EKG None  Radiology CT Head Wo Contrast  Result Date: 12/23/2019 CLINICAL DATA:  Headache and weakness.  Rule out stroke EXAM: CT HEAD WITHOUT CONTRAST TECHNIQUE: Contiguous axial images were obtained from the base of the skull through the vertex without intravenous contrast. COMPARISON:  None. FINDINGS: Brain: No evidence of acute infarction, hemorrhage, hydrocephalus, extra-axial collection or mass lesion/mass effect. Vascular: Negative for hyperdense vessel Skull: Several ill-defined low-density lesions in the left frontal bone. These have ill-defined margins and are nonspecific. No other skull lesion identified. Negative skull base. Sinuses/Orbits: Negative Other: None IMPRESSION: Normal CT of the brain Several small ill-defined lesions in the frontal bone are nonspecific. These may be benign lesions however myeloma or metastatic disease could have this appearance. Electronically Signed   By: Franchot Gallo M.D.   On: 12/23/2019 12:39    Procedures Procedures (including critical care time)  Medications Ordered in ED Medications - No data to display  ED Course  I have reviewed the triage vital signs and the nursing notes.  Pertinent labs & imaging results that were available during my care of the patient were reviewed by me and considered in my medical decision making (see chart for details).    MDM Rules/Calculators/A&P                      Patient with a headache and slurred speech.  He was seen by telemetry neurology and they suspect a migraine variant.  They recommend an MRI of the brain as an outpatient.  This is been set up.  His CT scan did show some possible bony lesions that could be a myeloma.  I spoke with the oncologist and we are going  to get serum protein electrophoresis, free light changes and immunofixation tests.  He will follow-up with the oncologist.  The patient also has been put on lisinopril for blood pressure Final Clinical Impression(s) / ED Diagnoses Final diagnoses:  Migraine without aura and with status migrainosus, not intractable    Rx / DC Orders ED Discharge Orders         Ordered    MR BRAIN W WO CONTRAST     12/23/19 1505    lisinopril (ZESTRIL) 20 MG tablet  Daily     12/23/19 1510           Bethann Berkshire, MD 12/23/19 1514

## 2019-12-24 LAB — PROTEIN ELECTROPHORESIS, SERUM
A/G Ratio: 1.3 (ref 0.7–1.7)
Albumin ELP: 4.6 g/dL — ABNORMAL HIGH (ref 2.9–4.4)
Alpha-1-Globulin: 0.2 g/dL (ref 0.0–0.4)
Alpha-2-Globulin: 0.7 g/dL (ref 0.4–1.0)
Beta Globulin: 1.1 g/dL (ref 0.7–1.3)
Gamma Globulin: 1.6 g/dL (ref 0.4–1.8)
Globulin, Total: 3.5 g/dL (ref 2.2–3.9)
Total Protein ELP: 8.1 g/dL (ref 6.0–8.5)

## 2019-12-24 LAB — KAPPA/LAMBDA LIGHT CHAINS
Kappa free light chain: 18.7 mg/L (ref 3.3–19.4)
Kappa, lambda light chain ratio: 0.89 (ref 0.26–1.65)
Lambda free light chains: 21.1 mg/L (ref 5.7–26.3)

## 2019-12-26 LAB — IMMUNOFIXATION ELECTROPHORESIS
IgA: 304 mg/dL (ref 90–386)
IgG (Immunoglobin G), Serum: 1673 mg/dL — ABNORMAL HIGH (ref 603–1613)
IgM (Immunoglobulin M), Srm: 156 mg/dL (ref 20–172)
Total Protein ELP: 8.2 g/dL (ref 6.0–8.5)

## 2019-12-29 ENCOUNTER — Ambulatory Visit (HOSPITAL_COMMUNITY)
Admission: RE | Admit: 2019-12-29 | Discharge: 2019-12-29 | Disposition: A | Discharge status: Home / Self Care | Payer: Commercial Managed Care - PPO | Source: Ambulatory Visit | Attending: Hematology | Admitting: Hematology

## 2019-12-29 ENCOUNTER — Encounter (HOSPITAL_COMMUNITY): Payer: Self-pay | Admitting: Hematology

## 2019-12-29 ENCOUNTER — Inpatient Hospital Stay (HOSPITAL_COMMUNITY): Payer: Commercial Managed Care - PPO

## 2019-12-29 ENCOUNTER — Inpatient Hospital Stay (HOSPITAL_COMMUNITY): Payer: Commercial Managed Care - PPO | Attending: Hematology | Admitting: Hematology

## 2019-12-29 ENCOUNTER — Other Ambulatory Visit: Payer: Self-pay

## 2019-12-29 DIAGNOSIS — M899 Disorder of bone, unspecified: Secondary | ICD-10-CM | POA: Insufficient documentation

## 2019-12-29 DIAGNOSIS — M25562 Pain in left knee: Secondary | ICD-10-CM | POA: Insufficient documentation

## 2019-12-29 DIAGNOSIS — R519 Headache, unspecified: Secondary | ICD-10-CM | POA: Diagnosis not present

## 2019-12-29 DIAGNOSIS — R531 Weakness: Secondary | ICD-10-CM | POA: Diagnosis not present

## 2019-12-29 DIAGNOSIS — I10 Essential (primary) hypertension: Secondary | ICD-10-CM | POA: Insufficient documentation

## 2019-12-29 DIAGNOSIS — Z8249 Family history of ischemic heart disease and other diseases of the circulatory system: Secondary | ICD-10-CM | POA: Diagnosis not present

## 2019-12-29 DIAGNOSIS — R4781 Slurred speech: Secondary | ICD-10-CM | POA: Insufficient documentation

## 2019-12-29 DIAGNOSIS — Z79899 Other long term (current) drug therapy: Secondary | ICD-10-CM | POA: Diagnosis not present

## 2019-12-29 LAB — LACTATE DEHYDROGENASE: LDH: 113 U/L (ref 98–192)

## 2019-12-29 NOTE — Patient Instructions (Signed)
[facility] at [facility] Discharge Instructions  You were seen today by Dr. Ellin Saba. He went over your history, family history and how you've been feeling since your ER visit. He will schedule you for a skeletal survey. You will have blood drawn prior to leaving the hospital. He will see you back in 2 weeks for follow up.   Thank you for choosing [facility] at [facility] to provide your oncology and hematology care.  To afford each patient quality time with our provider, please arrive at least 15 minutes before your scheduled appointment time.   If you have a lab appointment with the Cancer Center please come in thru the  Main Entrance and check in at the main information desk  You need to re-schedule your appointment should you arrive 10 or more minutes late.  We strive to give you quality time with our providers, and arriving late affects you and other patients whose appointments are after yours.  Also, if you no show three or more times for appointments you may be dismissed from the clinic at the providers discretion.     Again, thank you for choosing [facility].  Our hope is that these requests will decrease the amount of time that you wait before being seen by our physicians.       _____________________________________________________________  Should you have questions after your visit to [facility], please contact our office at [phone] between the hours of 8:00 a.m. and 4:30 p.m.  Voicemails left after 4:00 p.m. will not be returned until the following business day.  For prescription refill requests, have your pharmacy contact our office and allow 72 hours.    Cancer Center Support Programs:   > Cancer Support Group  2nd Tuesday of the month 1pm-2pm, Journey Room

## 2019-12-29 NOTE — Assessment & Plan Note (Addendum)
1.  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 -He was evaluated by stroke team.  CT scan of the head without contrast on 12/23/2019 showed normal brain morphology. -Several small subcentimeter ill-defined lesions in the left frontal bone was nonspecific. -I reviewed SPEP, free light chains and serum immunofixation which were normal. -CBC, calcium and creatinine were normal. -I discussed the results and the scans with the patient and his mother in detail.  The likelihood of myeloma is low. -We will check 24-hour urine for UPEP, immunofixation and light chains.  We will also check an LDH level. -We will do skeletal survey to evaluate for any other lesions. -We will see him back in 2 to 3 weeks to discuss results and follow-up.  2.  Hypertension: -He has on and off episodes of hypertension over several years.  He reports family history of hypertension. -Since he was evaluated in the ER, he is taking lisinopril 20 mg daily. -He reportedly checked his blood pressure at home yesterday which was 116/78.  Today in the office it is 160/103.  He reports that he was a little nervous. -He is in the process of finding a primary care doctor for better management of hypertension.  He will likely require work-up for causes of secondary hypertension.
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
## 2020-01-01 DIAGNOSIS — M899 Disorder of bone, unspecified: Secondary | ICD-10-CM | POA: Diagnosis not present

## 2020-01-05 LAB — UPEP/UIFE/LIGHT CHAINS/TP, 24-HR UR
% BETA, Urine: 38.6 %
ALPHA 1 URINE: 0.5 %
Albumin, U: 22.4 %
Alpha 2, Urine: 20.1 %
Free Kappa Lt Chains,Ur: 35.34 mg/L (ref 0.63–113.79)
Free Kappa/Lambda Ratio: 11.4 (ref 1.03–31.76)
Free Lambda Lt Chains,Ur: 3.1 mg/L (ref 0.47–11.77)
GAMMA GLOBULIN URINE: 18.5 %
Total Protein, Urine-Ur/day: 70 mg/24 hr (ref 30–150)
Total Protein, Urine: 8.2 mg/dL
Total Volume: 850

## 2020-01-08 ENCOUNTER — Ambulatory Visit (INDEPENDENT_AMBULATORY_CARE_PROVIDER_SITE_OTHER): Payer: Commercial Managed Care - PPO

## 2020-01-08 ENCOUNTER — Ambulatory Visit
Admission: EM | Admit: 2020-01-08 | Discharge: 2020-01-08 | Disposition: A | Discharge status: Home / Self Care | Payer: Commercial Managed Care - PPO | Attending: Emergency Medicine | Admitting: Emergency Medicine

## 2020-01-08 ENCOUNTER — Other Ambulatory Visit: Payer: Self-pay

## 2020-01-08 DIAGNOSIS — M25562 Pain in left knee: Secondary | ICD-10-CM | POA: Diagnosis not present

## 2020-01-08 DIAGNOSIS — M25362 Other instability, left knee: Secondary | ICD-10-CM

## 2020-01-08 DIAGNOSIS — S8992XA Unspecified injury of left lower leg, initial encounter: Secondary | ICD-10-CM

## 2020-01-08 DIAGNOSIS — M25462 Effusion, left knee: Secondary | ICD-10-CM

## 2020-01-08 NOTE — ED Provider Notes (Addendum)
[facility]ARE [facility]ENTER   [id number] 01/08/20 Arrival Time: 1110  CC:  LT knee PAIN  SUBJECTIVE: History from: patient. Aaron Parsons is a 34 y.o. male complains of left knee pain and swelling x 2 days.  Symptoms began after standing from a seated position and knee gave out.  States he felt his knee cap move.  Localizes the pain to the back of knee.  Describes the pain as intermittent and sharp in character.  States it takes his breath away at times.  Has tried OTC medications without relief.  Symptoms are made worse with walking, and weight-bearing.  Complains of associated swelling.   Denies fever, chills, erythema, ecchymosis, weakness, numbness and tingling  ROS: As per HPI.  All other pertinent ROS negative.     Past Medical History:  Diagnosis Date  . Abscess   . Fracture of finger of right hand    History reviewed. No pertinent surgical history. No Known Allergies No current facility-administered medications on file prior to encounter.   Current Outpatient Medications on File Prior to Encounter  Medication Sig Dispense Refill  . lisinopril (ZESTRIL) 20 MG tablet Take 1 tablet (20 mg total) by mouth daily. 30 tablet 0   Social History   Socioeconomic History  . Marital status: Single    Spouse name: Not on file  . Number of children: Not on file  . Years of education: Not on file  . Highest education level: Not on file  Occupational History  . Not on file  Tobacco Use  . Smoking status: Never Smoker  . Smokeless tobacco: Never Used  Substance and Sexual Activity  . Alcohol use: Yes    Comment: occasional  . Drug use: No  . Sexual activity: Not on file  Other Topics Concern  . Not on file  Social History Narrative  . Not on file   Social Determinants of Health   Financial Resource Strain:   . Difficulty of Paying Living Expenses:   Food Insecurity:   . Worried About Programme researcher, broadcasting/film/video in the Last Year:   . Barista in the Last Year:     Transportation Needs:   . Freight forwarder (Medical):   Marland Kitchen Lack of Transportation (Non-Medical):   Physical Activity:   . Days of Exercise per Week:   . Minutes of Exercise per Session:   Stress:   . Feeling of Stress :   Social Connections:   . Frequency of Communication with Friends and Family:   . Frequency of Social Gatherings with Friends and Family:   . Attends Religious Services:   . Active Member of Clubs or Organizations:   . Attends Banker Meetings:   Marland Kitchen Marital Status:   Intimate Partner Violence:   . Fear of Current or Ex-Partner:   . Emotionally Abused:   Marland Kitchen Physically Abused:   . Sexually Abused:    Family History  Problem Relation Age of Onset  . Hypertension Mother   . Healthy Father     OBJECTIVE:  Vitals:   01/08/20 1123  BP: (!) 162/112  Pulse: 82  Temp: 99 F (37.2 C)  SpO2: 98%    General appearance: ALERT; in no acute distress.  Head: NCAT Lungs: Normal respiratory effort CV: dorsalis pedis pulse 2+ . Cap refill < 2 seconds Musculoskeletal: LT knee Inspection: Swelling evident Palpation: Diffusely TTP to palpation over inferior lateral knee, and posterior knee; significantly TTP over posterior knee ROM: LROM Strength:  3+/5 knee flexion, 3+/5 knee extension Stability: Anterior/ posterior drawer intact Skin: warm and dry Neurologic: Ambulates without difficulty; Sensation intact about the upper/ lower extremities Psychological: alert and cooperative; normal mood and affect  DIAGNOSTIC STUDIES:  DG Knee Complete 4 Views Left  Result Date: 01/08/2020 CLINICAL DATA:  Right knee pain and instability. The patient fell. EXAM: LEFT KNEE - COMPLETE 4+ VIEW COMPARISON:  None. FINDINGS: No evidence of fracture or dislocation. Suggestion of a small effusion. No evidence of arthropathy or other focal bone abnormality. Soft tissues are unremarkable. IMPRESSION: Possible small knee effusion.  No bone abnormalities. Electronically Signed    By: Lorriane Shire M.D.   On: 01/08/2020 11:46    I have reviewed the x-rays myself and the radiologist interpretation. I am in agreement with the radiologist interpretation.     ASSESSMENT & PLAN:  1. Acute pain of left knee   2. Pain and swelling of left knee   3. Injury of left knee, initial encounter   4. Effusion of left knee    X-rays negative for fracture or dislocation Continue conservative management of rest, ice, and elevation Avoid painful activities such as prolong weight-bearing, standing, and walking Use crutches as needed for comfort Use OTC tylenol or ibuprofen as needed for pain and swelling Follow up with orthopedist for further evaluation and management Return or go to the ER if you have any new or worsening symptoms (fever, chills, chest pain, worsening swelling, redness, worsening symptoms despite medication, etc...)   Will use OTC knee brace or knee brace at home  Reviewed expectations re: course of current medical issues. Questions answered. Outlined signs and symptoms indicating need for more acute intervention. Patient verbalized understanding. After Visit Summary given.    Lestine Box, PA-C 01/08/20 [place], [place], PA-C 01/08/20 1219

## 2020-01-08 NOTE — ED Triage Notes (Signed)
Pt presents with c/o left knee pain that began Tuesday, felt a slipping in knee and fell on right knee because of pain and instability of left

## 2020-01-08 NOTE — Discharge Instructions (Signed)
X-rays negative for fracture or dislocation Continue conservative management of rest, ice, and elevation Avoid painful activities such as prolong weight-bearing, standing, and walking Use crutches as needed for comfort Use OTC tylenol or ibuprofen as needed for pain and swelling Follow up with orthopedist for further evaluation and management Return or go to the ER if you have any new or worsening symptoms (fever, chills, chest pain, worsening swelling, redness, worsening symptoms despite medication, etc...)

## 2020-01-12 ENCOUNTER — Inpatient Hospital Stay (HOSPITAL_BASED_OUTPATIENT_CLINIC_OR_DEPARTMENT_OTHER): Payer: Commercial Managed Care - PPO | Admitting: Hematology

## 2020-01-12 ENCOUNTER — Other Ambulatory Visit (HOSPITAL_COMMUNITY): Payer: Self-pay | Admitting: *Deleted

## 2020-01-12 ENCOUNTER — Other Ambulatory Visit: Payer: Self-pay

## 2020-01-12 ENCOUNTER — Encounter (HOSPITAL_COMMUNITY): Payer: Self-pay | Admitting: Hematology

## 2020-01-12 VITALS — BP 155/115 | HR 85 | Temp 96.9°F | Resp 19 | Wt 254.3 lb

## 2020-01-12 DIAGNOSIS — I10 Essential (primary) hypertension: Secondary | ICD-10-CM | POA: Diagnosis not present

## 2020-01-12 DIAGNOSIS — M899 Disorder of bone, unspecified: Secondary | ICD-10-CM

## 2020-01-12 MED ORDER — LISINOPRIL 40 MG PO TABS: 40.0000 mg | ORAL_TABLET | Freq: Every day | ORAL | 3 refills | Status: DC

## 2020-01-12 MED ORDER — LISINOPRIL 40 MG PO TABS: 20.0000 mg | ORAL_TABLET | Freq: Every day | ORAL | 1 refills | Status: DC

## 2020-01-12 NOTE — Progress Notes (Signed)
Aaron Parsons, [place] [address]   CLINIC:  Medical Oncology/Hematology  PCP:  Annie Main, [place] [address] [phone]   REASON FOR VISIT:  Follow-up for lytic lesions and hypertension.  CURRENT THERAPY: Lisinopril  INTERVAL HISTORY:  Aaron Parsons 34 y.o. male seen for follow-up of lytic lesions on the CT scan hypertension.  Reportedly developed left knee pain when he tried to get up from sitting position.  He also heard a pop when he got up.  He was evaluated in the ER with an x-ray.  He is scheduled for an MRI on Friday.  He is reportedly taking lisinopril 20 mg daily.  He does not report any dizziness but has some headaches.  Appetite is 75%.  Energy levels are 100%.    REVIEW OF SYSTEMS:  Review of Systems  Musculoskeletal:       Left knee pain.  Neurological: Positive for headaches.  All other systems reviewed and are negative.    PAST MEDICAL/SURGICAL HISTORY:  Past Medical History:  Diagnosis Date  . Abscess   . Fracture of finger of right hand    History reviewed. No pertinent surgical history.   SOCIAL HISTORY:  Social History   Socioeconomic History  . Marital status: Single    Spouse name: Not on file  . Number of children: Not on file  . Years of education: Not on file  . Highest education level: Not on file  Occupational History  . Not on file  Tobacco Use  . Smoking status: Never Smoker  . Smokeless tobacco: Never Used  Substance and Sexual Activity  . Alcohol use: Yes    Comment: occasional  . Drug use: No  . Sexual activity: Not on file  Other Topics Concern  . Not on file  Social History Narrative  . Not on file   Social Determinants of Health   Financial Resource Strain:   . Difficulty of Paying Living Expenses:   Food Insecurity:   . Worried About Charity fundraiser in the Last Year:   . Arboriculturist in the Last Year:   Transportation Needs:   . Consulting civil engineer (Medical):   Marland Kitchen Lack of Transportation (Non-Medical):   Physical Activity:   . Days of Exercise per Week:   . Minutes of Exercise per Session:   Stress:   . Feeling of Stress :   Social Connections:   . Frequency of Communication with Friends and Family:   . Frequency of Social Gatherings with Friends and Family:   . Attends Religious Services:   . Active Member of Clubs or Organizations:   . Attends Archivist Meetings:   Marland Kitchen Marital Status:   Intimate Partner Violence:   . Fear of Current or Ex-Partner:   . Emotionally Abused:   Marland Kitchen Physically Abused:   . Sexually Abused:     FAMILY HISTORY:  Family History  Problem Relation Age of Onset  . Hypertension Mother   . Healthy Father     CURRENT MEDICATIONS:  Outpatient Encounter Medications as of 01/12/2020  Medication Sig  . lisinopril (ZESTRIL) 40 MG tablet Take 0.5 tablets (20 mg total) by mouth daily.  Marland Kitchen lisinopril (ZESTRIL) 40 MG tablet Take 1 tablet (40 mg total) by mouth daily.  . [DISCONTINUED] lisinopril (ZESTRIL) 20 MG tablet Take 1 tablet (20 mg total) by mouth daily.   No facility-administered encounter medications on  file as of 01/12/2020.    ALLERGIES:  No Known Allergies   PHYSICAL EXAM:  ECOG Performance status: 0  Vitals:   01/12/20 1549  BP: (!) 155/115  Pulse: 85  Resp: 19  Temp: (!) 96.9 F (36.1 C)  SpO2: 99%   Filed Weights   01/12/20 1549  Weight: 254 lb 4.8 oz (115.3 kg)    Physical Exam Vitals reviewed.  Constitutional:      Appearance: Normal appearance.  Musculoskeletal:     Right lower leg: No edema.     Left lower leg: No edema.  Neurological:     General: No focal deficit present.     Mental Status: He is alert and oriented to person, place, and time.  Psychiatric:        Mood and Affect: Mood normal.        Behavior: Behavior normal.      LABORATORY DATA:  I have reviewed the labs as listed.  CBC    Component Value Date/Time   WBC 8.1  12/23/2019 1350   RBC 5.34 12/23/2019 1350   HGB 16.3 12/23/2019 1350   HCT 47.7 12/23/2019 1350   PLT 228 12/23/2019 1350   MCV 89.3 12/23/2019 1350   MCH 30.5 12/23/2019 1350   MCHC 34.2 12/23/2019 1350   RDW 13.7 12/23/2019 1350   LYMPHSABS 2.8 12/23/2019 1350   MONOABS 0.5 12/23/2019 1350   EOSABS 0.1 12/23/2019 1350   BASOSABS 0.0 12/23/2019 1350   CMP Latest Ref Rng & Units 12/23/2019 05/23/2009 05/08/2009  Glucose 70 - 99 mg/dL 92 94 326(Z)  BUN 6 - 20 mg/dL 14 6 5(L)  Creatinine 1.24 - 1.24 mg/dL 5.80 9.98 3.38  Sodium 135 - 145 mmol/L 137 137 130(L)  Potassium 3.5 - 5.1 mmol/L 4.0 4.6 3.7  Chloride 98 - 111 mmol/L 105 104 100  CO2 22 - 32 mmol/L 24 26 23   Calcium 8.9 - 10.3 mg/dL 9.5 9.2 8.8  Total Protein 6.5 - 8.1 g/dL 8.2(H) - -  Total Bilirubin 0.3 - 1.2 mg/dL 0.6 - -  Alkaline Phos 38 - 126 U/L 57 - -  AST 15 - 41 U/L 16 - -  ALT 0 - 44 U/L 21 - -       DIAGNOSTIC IMAGING:  I have independently reviewed the scans and discussed with the patient.     ASSESSMENT & PLAN:   Lytic bone lesions on xray 1.  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 -SPEP, immunofixation and free light chains were normal.  Calcium was normal. -We reviewed results of 24-hour urine which showed UPEP and urine immunofixation was negative. -We reviewed results of skeletal survey which did not show any lytic lesions.  Frontal lesions are not as clear on the CT scan. -I do not believe any further work-up is necessary at this time.  I plan to repeat his myeloma labs in 4 months.  2.  Hypertension: -He had on and off episodes of hypertension over several years.  He reports family history of hypertension. -He was started on lisinopril 20 mg daily on his most recent visit on 12/23/2019.  He was started on lisinopril 20 mg  daily. -His blood pressure today is elevated at 155/115.  I will increase lisinopril to 40 mg daily. -We will reevaluate him in 2 weeks.  If there is no improvement we will consider adding HCTZ. -He is trying to establish a primary doctor.  If he is able to see his primary doctor within the next 2 weeks, we will cancel our appointment.      Orders placed this encounter:  No orders of the defined types were placed in this encounter.     Doreatha Massed, MD [facility] [phone]

## 2020-01-12 NOTE — Patient Instructions (Addendum)
[facility] at [place] Discharge Instructions  You were seen today by Dr. Ellin Saba. He went over your recent lab and test results. Your body scan was good. Your tests were negative. He will see you back in 4 months for labs and follow up.   Thank you for choosing [facility] at [facility] to provide your oncology and hematology care.  To afford each patient quality time with our provider, please arrive at least 15 minutes before your scheduled appointment time.   If you have a lab appointment with the Cancer Center please come in thru the  Main Entrance and check in at the main information desk  You need to re-schedule your appointment should you arrive 10 or more minutes late.  We strive to give you quality time with our providers, and arriving late affects you and other patients whose appointments are after yours.  Also, if you no show three or more times for appointments you may be dismissed from the clinic at the providers discretion.     Again, thank you for choosing [facility].  Our hope is that these requests will decrease the amount of time that you wait before being seen by our physicians.       _____________________________________________________________  Should you have questions after your visit to [facility], please contact our office at [phone] between the hours of 8:00 a.m. and 4:30 p.m.  Voicemails left after 4:00 p.m. will not be returned until the following business day.  For prescription refill requests, have your pharmacy contact our office and allow 72 hours.    Cancer Center Support Programs:   > Cancer Support Group  2nd Tuesday of the month 1pm-2pm, Journey Room

## 2020-01-12 NOTE — Assessment & Plan Note (Signed)
1.  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 -SPEP, immunofixation and free light chains were normal.  Calcium was normal. -We reviewed results of 24-hour urine which showed UPEP and urine immunofixation was negative. -We reviewed results of skeletal survey which did not show any lytic lesions.  Frontal lesions are not as clear on the CT scan. -I do not believe any further work-up is necessary at this time.  I plan to repeat his myeloma labs in 4 months.  2.  Hypertension: -He had on and off episodes of hypertension over several years.  He reports family history of hypertension. -He was started on lisinopril 20 mg daily on his most recent visit on 12/23/2019.  He was started on lisinopril 20 mg daily. -His blood pressure today is elevated at 155/115.  I will increase lisinopril to 40 mg daily. -We will reevaluate him in 2 weeks.  If there is no improvement we will consider adding HCTZ. -He is trying to establish a primary doctor.  If he is able to see his primary doctor within the next 2 weeks, we will cancel our appointment.

## 2020-01-29 ENCOUNTER — Ambulatory Visit (HOSPITAL_COMMUNITY): Payer: Commercial Managed Care - PPO | Admitting: Hematology

## 2020-05-06 ENCOUNTER — Inpatient Hospital Stay (HOSPITAL_COMMUNITY): Payer: Commercial Managed Care - PPO | Attending: Hematology

## 2020-05-13 ENCOUNTER — Ambulatory Visit (HOSPITAL_COMMUNITY): Payer: Commercial Managed Care - PPO | Admitting: Hematology

## 2021-12-31 IMAGING — DX DG KNEE COMPLETE 4+V*L*
4 series · 4 of 4 positions shown · non-contrast
Comparison: None.
COMPARISON: None.

Addendum:
CLINICAL DATA: Right knee pain and instability. The patient fell.

EXAM:
LEFT KNEE - COMPLETE 4+ VIEW

[knee ap]
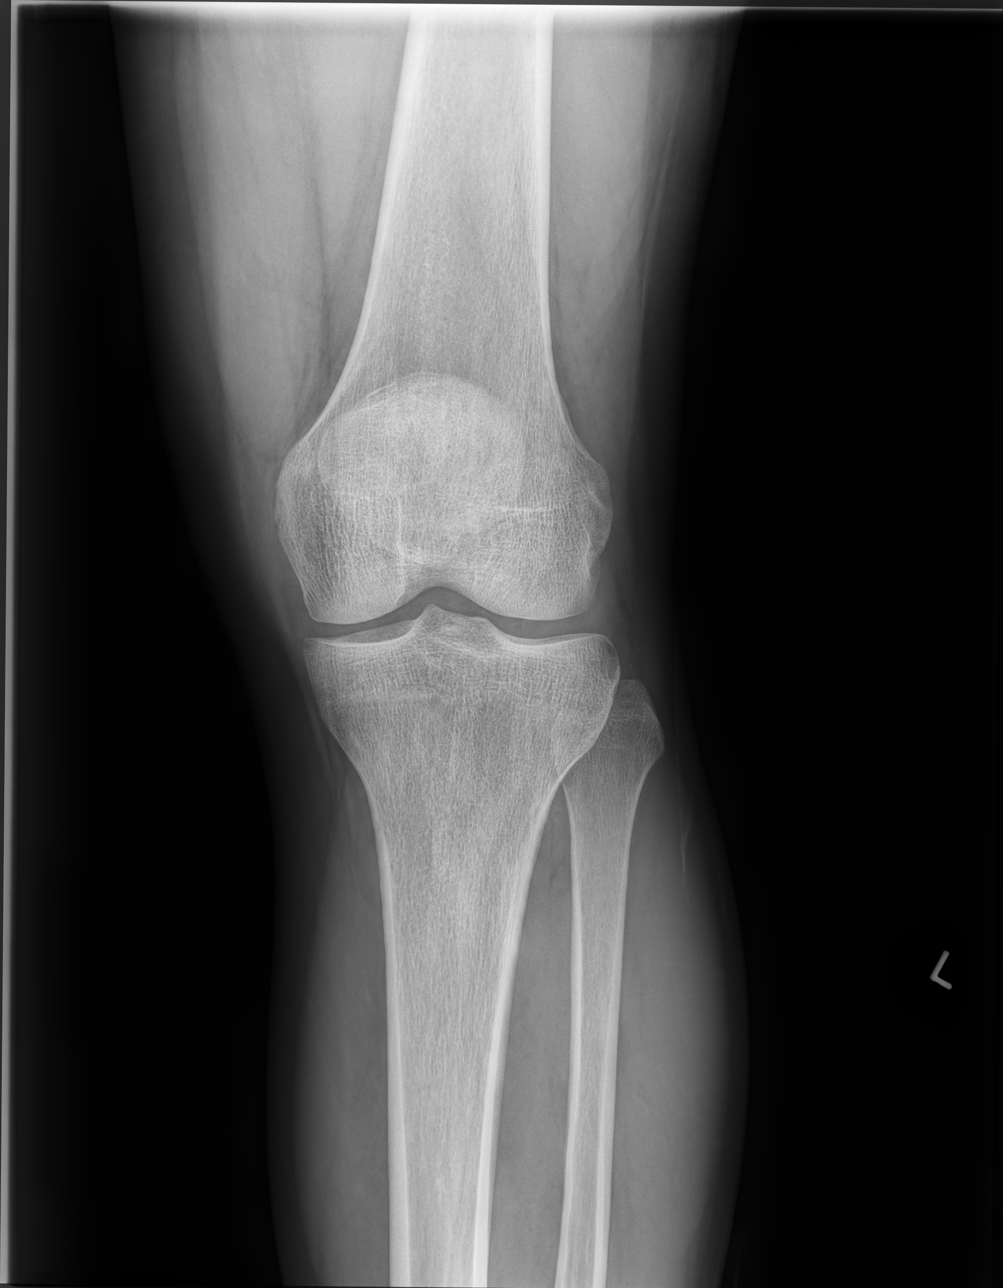

[knee mlo (1 of 2)]
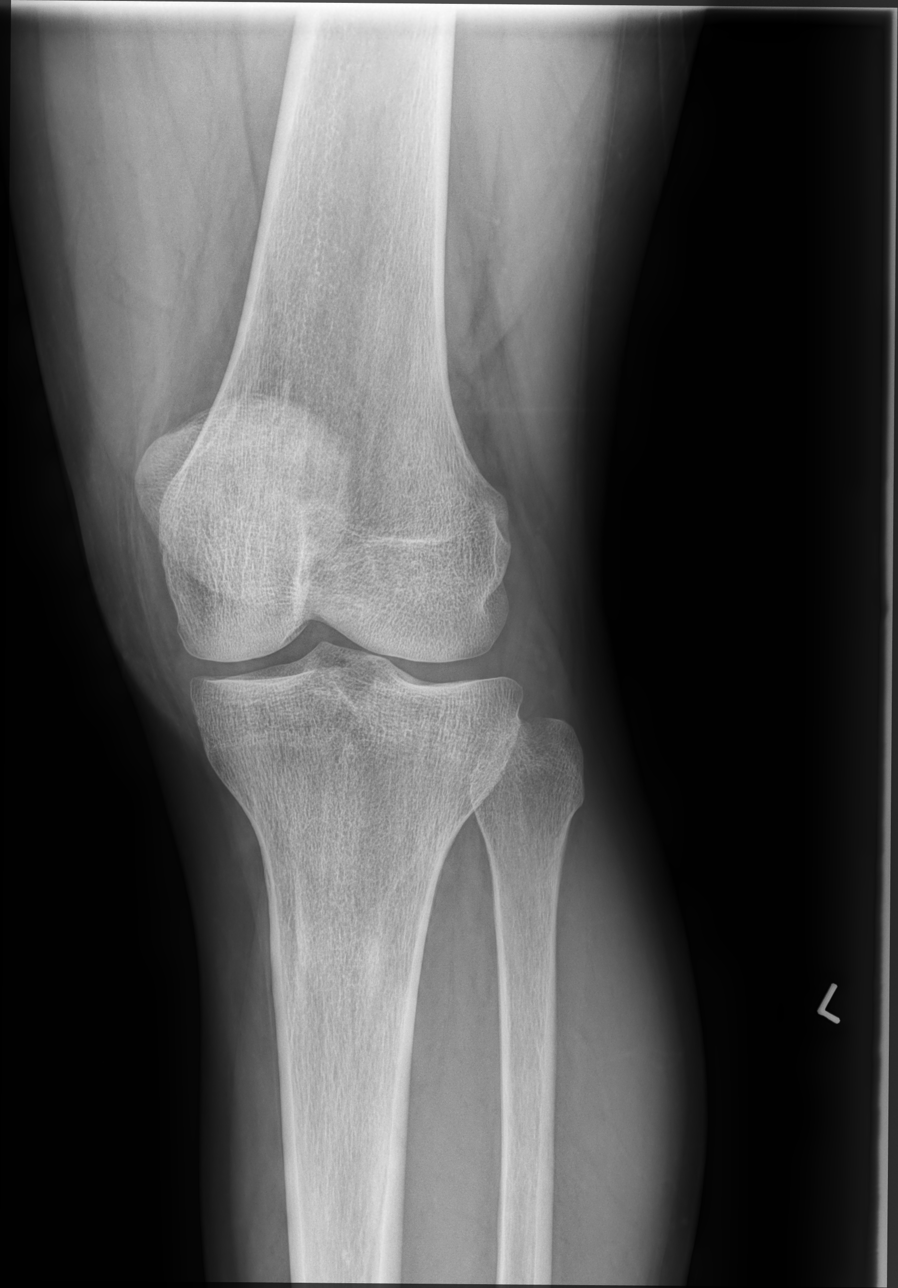

[knee mlo (2 of 2)]
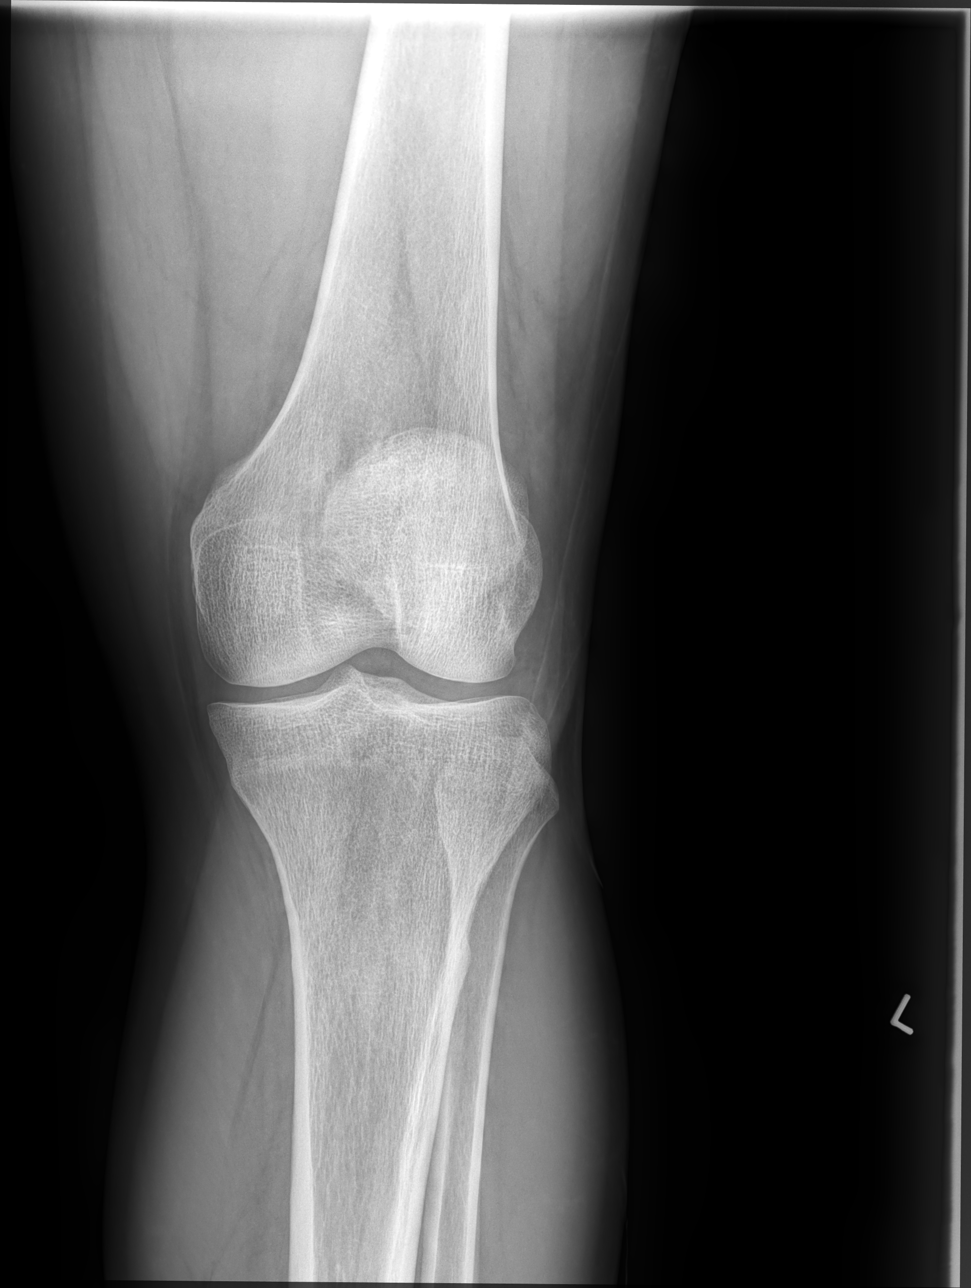

[knee lat]
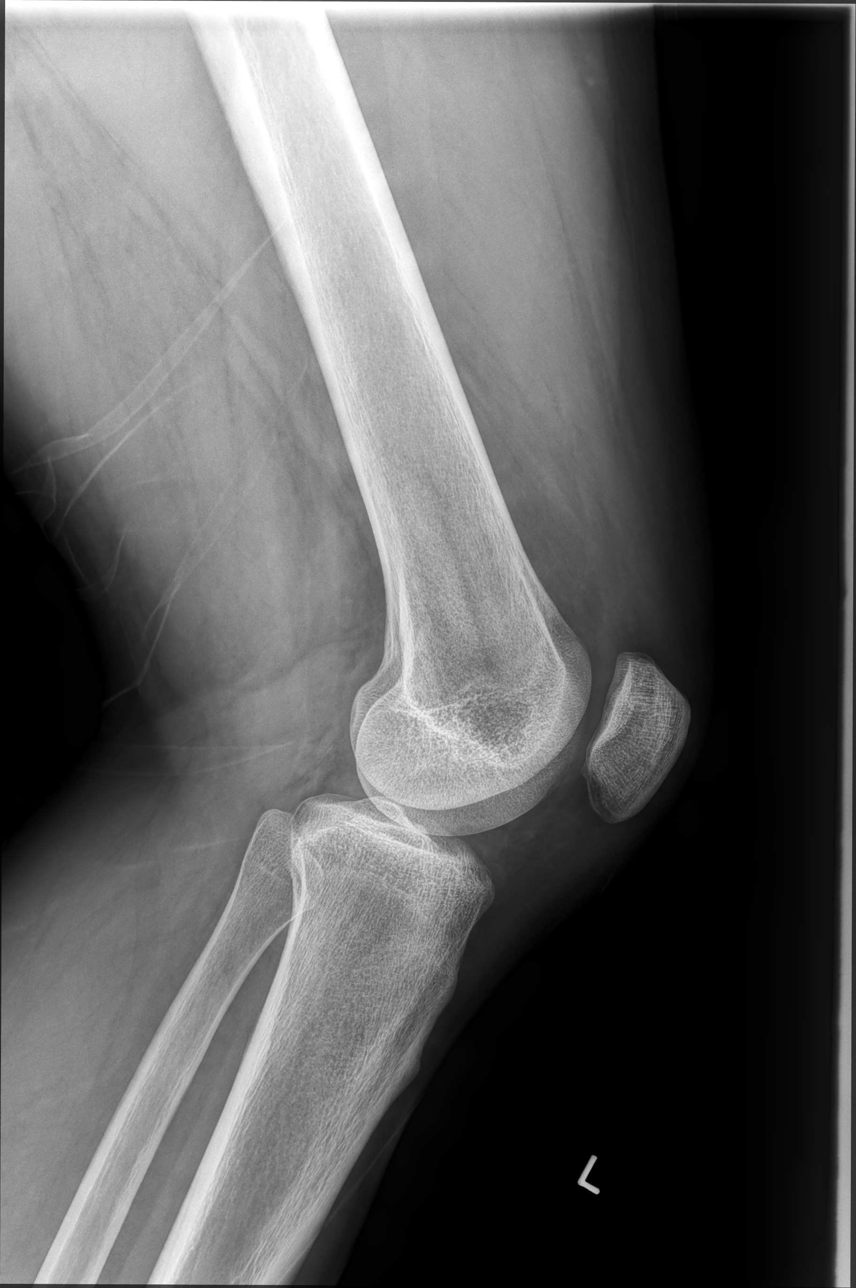

[4 of 4 positions shown; findings below may reference images not displayed]

FINDINGS: No evidence of fracture or dislocation. Suggestion of a small
effusion. No evidence of arthropathy or other focal bone
abnormality. Soft tissues are unremarkable.
IMPRESSION: Possible small knee effusion.  No bone abnormalities.

ADDENDUM:
Correction: The clinical history is LEFT knee pain.

*** End of Addendum ***
FINDINGS: No evidence of fracture or dislocation. Suggestion of a small
effusion. No evidence of arthropathy or other focal bone
abnormality. Soft tissues are unremarkable.
IMPRESSION: Possible small knee effusion.  No bone abnormalities.

## 2022-10-07 ENCOUNTER — Encounter (HOSPITAL_COMMUNITY): Payer: Self-pay

## 2022-10-07 ENCOUNTER — Emergency Department (HOSPITAL_COMMUNITY)
Admission: EM | Admit: 2022-10-07 | Discharge: 2022-10-07 | Disposition: A | Discharge status: Home / Self Care | Payer: BC Managed Care – PPO | Attending: Emergency Medicine | Admitting: Emergency Medicine

## 2022-10-07 ENCOUNTER — Other Ambulatory Visit: Payer: Self-pay

## 2022-10-07 DIAGNOSIS — Z79899 Other long term (current) drug therapy: Secondary | ICD-10-CM | POA: Diagnosis not present

## 2022-10-07 DIAGNOSIS — H66002 Acute suppurative otitis media without spontaneous rupture of ear drum, left ear: Secondary | ICD-10-CM | POA: Insufficient documentation

## 2022-10-07 DIAGNOSIS — H9209 Otalgia, unspecified ear: Secondary | ICD-10-CM | POA: Diagnosis present

## 2022-10-07 DIAGNOSIS — H60502 Unspecified acute noninfective otitis externa, left ear: Secondary | ICD-10-CM | POA: Diagnosis not present

## 2022-10-07 MED ORDER — AMLODIPINE BESYLATE 5 MG PO TABS: 5.0000 mg | ORAL_TABLET | Freq: Every day | ORAL | 2 refills | Status: DC

## 2022-10-07 MED ORDER — NEOMYCIN-POLYMYXIN-HC 3.5-10000-1 OT SUSP: 4.0000 [drp] | Freq: Four times a day (QID) | OTIC | 0 refills | Status: DC

## 2022-10-07 MED ORDER — AMOXICILLIN-POT CLAVULANATE 875-125 MG PO TABS: 1.0000 | ORAL_TABLET | Freq: Two times a day (BID) | ORAL | 0 refills | Status: DC

## 2022-10-07 NOTE — ED Provider Notes (Signed)
[facility] EMERGENCY DEPARTMENT Provider Note   CSN: [id number] Arrival date & time: 10/07/22  1418     History  Chief Complaint  Patient presents with   Otalgia    Aaron Parsons is a 36 y.o. male presents to the ED complaining of left sided ear pain since Thursday.  Patient states that his mother saw something in there as well.  Patient states that he has attempted to clean his ear with hydrogen peroxide and a Q-tip.  He states that he noticed some blood on the Q-tip yesterday morning and has not since attempted to clean his ear.  Patient states he flies a lot for work and his ear has not popped on that side since he began to have symptoms.  He also reports that his hearing is muffled.  He denies daily use of Q-tips.  Denies fever, chills, discharge from the ear, loss of hearing, tinnitus, headache.      Home Medications Prior to Admission medications   Medication Sig Start Date End Date Taking? Authorizing Provider  amLODipine (NORVASC) 5 MG tablet Take 1 tablet (5 mg total) by mouth daily. 10/07/22  Yes Jahnae Mcadoo R, PA  amoxicillin-clavulanate (AUGMENTIN) 875-125 MG tablet Take 1 tablet by mouth every 12 (twelve) hours. 10/07/22  Yes Nonnie Pickney R, PA  neomycin-polymyxin-hydrocortisone (CORTISPORIN) 3.5-10000-1 OTIC suspension Place 4 drops into the left ear 4 (four) times daily. 10/07/22  Yes Melton Alar R, PA      Allergies    Patient has no known allergies.    Review of Systems   Review of Systems  Constitutional:  Negative for fever.  HENT:  Positive for ear pain. Negative for congestion, ear discharge, rhinorrhea and tinnitus.   Neurological:  Negative for dizziness and headaches.    Physical Exam Updated Vital Signs BP (!) 170/114 (BP Location: Left Leg)   Pulse 80   Temp 98.2 F (36.8 C) (Oral)   Resp 18   Ht 6\' 3"  (1.905 m)   Wt (!) 137 kg   SpO2 96%   BMI 37.75 kg/m  Physical Exam Vitals and nursing note reviewed.  Constitutional:       General: He is not in acute distress.    Appearance: Normal appearance. He is not ill-appearing or diaphoretic.  HENT:     Right Ear: There is impacted cerumen.     Left Ear: Drainage and tenderness present. A middle ear effusion is present. There is no impacted cerumen. Tympanic membrane is injected and bulging.     Ears:     Comments: White clumpy discharge and mild bleeding to L EAC, TM bulging, mildly erythematous with yellow color mid ear effusion; lateralization to L ear with simple hum test Cardiovascular:     Rate and Rhythm: Normal rate and regular rhythm.     Pulses: Normal pulses.     Heart sounds: Normal heart sounds.  Pulmonary:     Effort: Pulmonary effort is normal.  Neurological:     Mental Status: He is alert. Mental status is at baseline.  Psychiatric:        Mood and Affect: Mood normal.        Behavior: Behavior normal.     ED Results / Procedures / Treatments   Labs (all labs ordered are listed, but only abnormal results are displayed) Labs Reviewed - No data to display  EKG None  Radiology No results found.  Procedures Procedures    Medications Ordered in ED Medications - No  data to display  ED Course/ Medical Decision Making/ A&P                           Medical Decision Making  This patient presents to the ED with chief complaint(s) of left ear pain and bleeding with pertinent past medical history of hypertension, lytic bone lesions.  The complaint involves an extensive differential diagnosis and also carries with it a high risk of complications and morbidity.    The differential diagnosis includes acute otitis media, acute otitis externa, ear canal trauma, eustachian tube dysfunction  The initial plan is to examine ear  Initial Assessment:   L EAC erythematous with scant amount of blood and white, clumpy discharge; L TM is bulging, mildly erythematous, with a yellow mid ear effusion present.  R EAC unremarkable, unable to visualize R  TM due to impacted cerumen.  Patient reports muffled hearing from left ear.  Lateralization to L ear with simple hum test.   Patient is also hypertensive, but asymptomatic.  Has been treated in the past for essential hypertension.  Independent ECG/labs interpretation:  The following labs were independently interpreted:  Not indicated  Independent visualization and interpretation of imaging: I independently visualized the following imaging with scope of interpretation limited to determining acute life threatening conditions related to emergency care: Not indicated  Treatment and Reassessment: Will have R ear washed out and re-examine.  Will treat patient's left ear with otic drops and systemic antibiotics for suspected otitis externa and otitis media.  Discussed with patient OTC options to air in relief including decongestants and antihistamines to help with fluid in the ear and any potential eustachian tube dysfunction due to patient having to fly often for work.   Impacted cerumen in R ear unable to be removed.  Pt denies symptoms in this ear.  Will refer pt back to PCP for further treatment if this ear begins to bother him.    Disposition:   The patient has been appropriately medically screened and/or stabilized in the ED. I have low suspicion for any other emergent medical condition which would require further screening, evaluation or treatment in the ED or require inpatient management. At time of discharge the patient is hemodynamically stable and in no acute distress. I have discussed work-up results and diagnosis with patient and answered all questions. Patient is agreeable with discharge plan. We discussed strict return precautions for returning to the emergency department and they verbalized understanding.  Antibiotics sent to patient's pharmacy.             Final Clinical Impression(s) / ED Diagnoses Final diagnoses:  Acute otitis externa of left ear, unspecified type   Non-recurrent acute suppurative otitis media of left ear without spontaneous rupture of tympanic membrane    Rx / DC Orders ED Discharge Orders          Ordered    amoxicillin-clavulanate (AUGMENTIN) 875-125 MG tablet  Every 12 hours        10/07/22 1629    neomycin-polymyxin-hydrocortisone (CORTISPORIN) 3.5-10000-1 OTIC suspension  4 times daily        10/07/22 1629    amLODipine (NORVASC) 5 MG tablet  Daily        10/07/22 1629              Melton Alar R, [place] 10/07/22 1631    Franne Forts, DO 10/11/22 1524

## 2022-10-07 NOTE — Discharge Instructions (Addendum)
Thank you for allowing me to be part of your care today.  I have sent over eardrops and an antibiotic to take to clear up your ear infection.  I also recommend the use of a nasal decongestion such as Flonase and/or an antihistamine such as Zyrtec, Claritin, Allegra to help with any possible eustachian tube dysfunction given that you fly very often for work.  When using the eardrops, I recommend laying on your side, instilling the drops in your ear, and waiting approximately 10 minutes to ensure the medicine has fully coated the ear canal and will be effective.  Please take the oral antibiotic for the entire 7 days.  I have also sent over prescription for amlodipine to your pharmacy to help treat hypertension.  I highly recommend you establish with a primary care physician for ongoing hypertension treatment.  You may use a few drops of mineral oil in your right ear to help soften ear wax.

## 2022-10-07 NOTE — ED Notes (Signed)
Patient blood pressure elevated, denies any shortness of breath or chest pain. EDP notified

## 2022-10-07 NOTE — ED Triage Notes (Signed)
Pt states left sided ear pain since Thursday. Pt states that his mother saw something in there, but he has not heard buzzing or any bug noises. Pt states that he got some blood on a q tip yesterday AM. Pt states hearing is muffled.

## 2022-12-14 ENCOUNTER — Ambulatory Visit: Payer: BC Managed Care – PPO | Admitting: Family Medicine

## 2022-12-22 ENCOUNTER — Ambulatory Visit: Payer: BC Managed Care – PPO | Admitting: Family Medicine

## 2022-12-22 ENCOUNTER — Encounter: Payer: Self-pay | Admitting: Family Medicine

## 2022-12-22 VITALS — BP 160/115 | HR 84 | Ht 74.0 in | Wt 289.0 lb

## 2022-12-22 DIAGNOSIS — Z1159 Encounter for screening for other viral diseases: Secondary | ICD-10-CM

## 2022-12-22 DIAGNOSIS — G4700 Insomnia, unspecified: Secondary | ICD-10-CM | POA: Insufficient documentation

## 2022-12-22 DIAGNOSIS — E785 Hyperlipidemia, unspecified: Secondary | ICD-10-CM | POA: Diagnosis not present

## 2022-12-22 DIAGNOSIS — E039 Hypothyroidism, unspecified: Secondary | ICD-10-CM | POA: Diagnosis not present

## 2022-12-22 DIAGNOSIS — I10 Essential (primary) hypertension: Secondary | ICD-10-CM

## 2022-12-22 DIAGNOSIS — R7301 Impaired fasting glucose: Secondary | ICD-10-CM

## 2022-12-22 DIAGNOSIS — L732 Hidradenitis suppurativa: Secondary | ICD-10-CM

## 2022-12-22 DIAGNOSIS — F5101 Primary insomnia: Secondary | ICD-10-CM

## 2022-12-22 DIAGNOSIS — Z114 Encounter for screening for human immunodeficiency virus [HIV]: Secondary | ICD-10-CM

## 2022-12-22 MED ORDER — TRAZODONE HCL 50 MG PO TABS: 25.0000 mg | ORAL_TABLET | Freq: Every evening | ORAL | 2 refills | Status: AC | PRN

## 2022-12-22 NOTE — Assessment & Plan Note (Signed)
Vitals:   12/22/22 0815 12/22/22 0819  BP: (!) 171/121 (!) 160/115  Blood pressure not controlled. Currently taking Norvasc 5 mg.        Explained non pharmacological interventions such as low salt, DASH diet discussed. Educated on stress reduction and physical activity. Discussed signs and symptoms of major cardiovascular event and need to present to the ED. Follow up in 1 week with blood pressure logs or sooner if needed to evaluate trends and medication management Patient verbalizes understanding regarding plan of care and all questions answered.

## 2022-12-22 NOTE — Assessment & Plan Note (Signed)
Prescribed Trazodone 25 mg Explained to go to bed at the same time each night and get up at the same time each morning, including on the weekends. Make sure your bedroom is quiet, dark, relaxing, and at a comfortable temperature. Remove electronic devices, such as TVs, computers, and smart phones, from the bedroom.

## 2022-12-22 NOTE — Progress Notes (Signed)
New Patient Office Visit   Subjective   Patient ID: Aaron Parsons, male    DOB: 1986/08/15  Age: 37 y.o. MRN: [id number]  CC:  Chief Complaint  Patient presents with   New Patient (Initial Visit)    New patient establish care    HPI Aaron Parsons 37 year old male, presents to establish care. He  has a past medical history of Abscess, Fracture of finger of right hand, Hidradenitis suppurativa, and Hypertension.  Patient here for elevated blood pressure. He is exercising and is not adherent to low salt diet.  Blood pressure unknown if  controlled at home. Patient denies cardiac symptoms chest pain, chest pressure/discomfort, dyspnea, exertional chest pressure/discomfort, lower extremity edema, palpitations, syncope, and tachypnea.Cardiovascular risk factors: family history of hypertension, male gender, obesity (BMI >= 30 kg/m2), and smoking/ tobacco exposure.   Insomnia The current episode started more than one month. The problem occurs nightly. Sleeping difficulty has been waxing and waning since onset. The symptoms are aggravated by anxiety and work stress. How many beverages per day that contain caffeine: 0 - 1.  The symptoms are relieved by darkened room, quiet environment and OTC melatonin medication. The treatment provided mild relief. Typical bedtime: 10-11 P.M.How long after going to bed to you fall asleep: 30-60 minutes. PMH includes: hypertension.       Outpatient Encounter Medications as of 12/22/2022  Medication Sig   amLODipine (NORVASC) 5 MG tablet Take 1 tablet (5 mg total) by mouth daily.   amoxicillin (AMOXIL) 500 MG capsule Take 500 mg by mouth 3 (three) times daily.   traZODone (DESYREL) 50 MG tablet Take 0.5 tablets (25 mg total) by mouth at bedtime as needed for sleep.   [DISCONTINUED] amoxicillin-clavulanate (AUGMENTIN) 875-125 MG tablet Take 1 tablet by mouth every 12 (twelve) hours. (Patient not taking: Reported on 12/22/2022)   [DISCONTINUED]  neomycin-polymyxin-hydrocortisone (CORTISPORIN) 3.5-10000-1 OTIC suspension Place 4 drops into the left ear 4 (four) times daily. (Patient not taking: Reported on 12/22/2022)   No facility-administered encounter medications on file as of 12/22/2022.    History reviewed. No pertinent surgical history.  Review of Systems  Constitutional:  Negative for chills and fever.  HENT:  Negative for ear pain and tinnitus.   Eyes:  Negative for blurred vision.  Respiratory:  Negative for shortness of breath.   Cardiovascular:  Negative for chest pain.  Gastrointestinal:  Negative for abdominal pain, nausea and vomiting.  Genitourinary:  Negative for dysuria.  Musculoskeletal:  Negative for myalgias.  Neurological:  Negative for dizziness and headaches.  Psychiatric/Behavioral:  Negative for depression.       Objective    BP (!) 160/115 (BP Location: Right Arm, Patient Position: Sitting, Cuff Size: Large)   Pulse 84   Ht '6\' 2"'$  (1.88 m)   Wt 289 lb 0.6 oz (131.1 kg)   SpO2 96%   BMI 37.11 kg/m   Physical Exam Cardiovascular:     Rate and Rhythm: Normal rate and regular rhythm.     Pulses: Normal pulses.     Heart sounds: Normal heart sounds.  Pulmonary:     Effort: Pulmonary effort is normal.     Breath sounds: Normal breath sounds.  Musculoskeletal:        General: Normal range of motion.  Skin:    General: Skin is warm and dry.     Capillary Refill: Capillary refill takes less than 2 seconds.     Comments: Right inner groin medium size lump associated  with hidradenitis suppurativa  Neurological:     General: No focal deficit present.     Mental Status: He is alert and oriented to person, place, and time.  Psychiatric:        Thought Content: Thought content normal.       Assessment & Plan:  Primary hypertension Assessment & Plan: Vitals:   12/22/22 0815 12/22/22 0819  BP: (!) 171/121 (!) 160/115  Blood pressure not controlled. Currently taking Norvasc 5 mg.        Explained  non pharmacological interventions such as low salt, DASH diet discussed. Educated on stress reduction and physical activity. Discussed signs and symptoms of major cardiovascular event and need to present to the ED. Follow up in 1 week with blood pressure logs or sooner if needed to evaluate trends and medication management Patient verbalizes understanding regarding plan of care and all questions answered.   Orders: -     Microalbumin / creatinine urine ratio -     CMP14+EGFR -     CBC with Differential/Platelet  IFG (impaired fasting glucose) -     Hemoglobin A1c  Hyperlipidemia, unspecified hyperlipidemia type -     Lipid panel  Hypothyroidism, unspecified type -     TSH + free T4  Need for hepatitis C screening test -     Hepatitis C antibody  Screening for HIV (human immunodeficiency virus) -     HIV Antibody (routine testing w rflx)  Hidradenitis suppurativa -     Ambulatory referral to Dermatology  Primary insomnia Assessment & Plan: Prescribed Trazodone 25 mg Explained to go to bed at the same time each night and get up at the same time each morning, including on the weekends. Make sure your bedroom is quiet, dark, relaxing, and at a comfortable temperature. Remove electronic devices, such as TVs, computers, and smart phones, from the bedroom.    Orders: -     traZODone HCl; Take 0.5 tablets (25 mg total) by mouth at bedtime as needed for sleep.  Dispense: 30 tablet; Refill: 2    Return in about 1 week (around 12/29/2022) for re-check blood pressure, hypertension.   Renard Hamper Ria Comment, FNP

## 2022-12-22 NOTE — Patient Instructions (Signed)
It was pleasure meeting with you today. Please take medications as prescribed. Follow up with your primary health provider if any health concerns arises. If symptoms worsen please contact your primary care provider and/or visit the emergency department.  

## 2022-12-29 ENCOUNTER — Telehealth: Payer: BC Managed Care – PPO | Admitting: Family Medicine

## 2022-12-29 ENCOUNTER — Encounter: Payer: Self-pay | Admitting: Family Medicine

## 2022-12-29 VITALS — Ht 75.0 in | Wt 280.0 lb

## 2022-12-29 DIAGNOSIS — I10 Essential (primary) hypertension: Secondary | ICD-10-CM | POA: Diagnosis not present

## 2022-12-29 MED ORDER — AMLODIPINE BESYLATE 10 MG PO TABS: 10.0000 mg | ORAL_TABLET | Freq: Every day | ORAL | 1 refills | Status: DC

## 2022-12-29 NOTE — Assessment & Plan Note (Addendum)
Blood pressure not controlled At home blood pressure reading: 161/117 , 158/11  Increased amlodipine 10 mg daily. Advise lifestyle modifications follow diet low in saturated fat, reduce dietary salt intake, avoid fatty foods, maintain an exercise routine 3 to 5 days a week for a minimum total of 150 minutes.  Follow up in 4 weeks for labs and bring blood pressure reading logs to evaluate trends

## 2022-12-29 NOTE — Progress Notes (Signed)
   Virtual Visit via Video Note  I connected with Tarri Abernethy on 12/29/22 at  8:40 AM EDT by a video enabled telemedicine application and verified that I am speaking with the correct person using two identifiers.   Patient Location: Home Provider Location: Home Office  I discussed the limitations, risks, security, and privacy concerns of performing an evaluation and management service by video and the availability of in person appointments. I also discussed with the patient that there may be a patient responsible charge related to this service. The patient expressed understanding and agreed to proceed.  Subjective: PCP: Juanda Chance, FNP  Chief Complaint  Patient presents with   Hypertension    Patient is being seen for a BP f/u. [place] of [place] and does not have his BP cuff with him currently so he has not been able to check it.    Hypertension The problem has been gradually worsening since onset. The problem is uncontrolled. Associated symptoms include malaise/fatigue. Pertinent negatives include no blurred vision, chest pain, headaches, palpitations or shortness of breath. Risk factors for coronary artery disease include family history. Past treatments include calcium channel blockers. The current treatment provides no improvement. There are no compliance problems.     ROS: Per HPI  Current Outpatient Medications:    amLODipine (NORVASC) 10 MG tablet, Take 1 tablet (10 mg total) by mouth daily., Disp: 30 tablet, Rfl: 1   amoxicillin (AMOXIL) 500 MG capsule, Take 500 mg by mouth 3 (three) times daily., Disp: , Rfl:    traZODone (DESYREL) 50 MG tablet, Take 0.5 tablets (25 mg total) by mouth at bedtime as needed for sleep., Disp: 30 tablet, Rfl: 2  Observations/Objective: Today's Vitals   12/29/22 0832  Weight: 280 lb (127 kg)  Height: 6\' 3"  (1.905 m)   Physical Exam Neurological:     Mental Status: He is alert.     Assessment and Plan: Primary  hypertension Assessment & Plan: Blood pressure not controlled At home blood pressure reading: 161/117 , 158/11  Increased amlodipine 10 mg daily. Advise lifestyle modifications follow diet low in saturated fat, reduce dietary salt intake, avoid fatty foods, maintain an exercise routine 3 to 5 days a week for a minimum total of 150 minutes.  Follow up in 4 weeks for labs and bring blood pressure reading logs to evaluate trends  Orders: -     amLODIPine Besylate; Take 1 tablet (10 mg total) by mouth daily.  Dispense: 30 tablet; Refill: 1    Follow Up Instructions: No follow-ups on file.   I discussed the assessment and treatment plan with the patient. The patient was provided an opportunity to ask questions, and all were answered. The patient agreed with the plan and demonstrated an understanding of the instructions.   The patient was advised to call back or seek an in-person evaluation if the symptoms worsen or if the condition fails to improve as anticipated.  The above assessment and management plan was discussed with the patient. The patient verbalized understanding of and has agreed to the management plan.   Renard Hamper Ria Comment, FNP

## 2023-01-26 ENCOUNTER — Ambulatory Visit: Payer: BC Managed Care – PPO | Admitting: Family Medicine

## 2023-02-06 ENCOUNTER — Other Ambulatory Visit: Payer: Self-pay | Admitting: Family Medicine

## 2023-02-06 DIAGNOSIS — I10 Essential (primary) hypertension: Secondary | ICD-10-CM
